# Patient Record
Sex: Female | Born: 1985 | Hispanic: No | Marital: Single | Smoking: Former smoker
Health system: Southern US, Community
[De-identification: ages and names within clinical notes are randomized; demographics above are authoritative.]

## PROBLEM LIST (undated history)

## (undated) ENCOUNTER — Inpatient Hospital Stay (HOSPITAL_COMMUNITY): Payer: Self-pay

## (undated) DIAGNOSIS — Z789 Other specified health status: Secondary | ICD-10-CM

## (undated) HISTORY — PX: INDUCED ABORTION: SHX677

## (undated) HISTORY — PX: DILATION AND CURETTAGE OF UTERUS: SHX78

---
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
## 2005-06-26 ENCOUNTER — Emergency Department (HOSPITAL_COMMUNITY): Admission: EM | Admit: 2005-06-26 | Discharge: 2005-06-26 | Payer: Self-pay | Admitting: Emergency Medicine

## 2006-02-02 ENCOUNTER — Emergency Department (HOSPITAL_COMMUNITY): Admission: EM | Admit: 2006-02-02 | Discharge: 2006-02-02 | Payer: Self-pay | Admitting: Emergency Medicine

## 2007-06-29 ENCOUNTER — Emergency Department (HOSPITAL_COMMUNITY): Admission: EM | Admit: 2007-06-29 | Discharge: 2007-06-29 | Payer: Self-pay | Admitting: Emergency Medicine

## 2008-02-20 ENCOUNTER — Emergency Department (HOSPITAL_COMMUNITY): Admission: EM | Admit: 2008-02-20 | Discharge: 2008-02-20 | Payer: Self-pay | Admitting: Emergency Medicine

## 2008-03-16 ENCOUNTER — Emergency Department (HOSPITAL_COMMUNITY): Admission: EM | Admit: 2008-03-16 | Discharge: 2008-03-16 | Payer: Self-pay | Admitting: Emergency Medicine

## 2009-12-20 ENCOUNTER — Emergency Department (HOSPITAL_COMMUNITY): Admission: EM | Admit: 2009-12-20 | Discharge: 2009-12-20 | Payer: Self-pay | Admitting: Emergency Medicine

## 2010-09-23 ENCOUNTER — Ambulatory Visit: Payer: Self-pay | Admitting: Obstetrics and Gynecology

## 2010-09-23 ENCOUNTER — Encounter (INDEPENDENT_AMBULATORY_CARE_PROVIDER_SITE_OTHER): Payer: Self-pay | Admitting: *Deleted

## 2010-10-07 ENCOUNTER — Ambulatory Visit: Payer: Self-pay | Admitting: Obstetrics and Gynecology

## 2010-10-12 ENCOUNTER — Emergency Department (HOSPITAL_COMMUNITY)
Admission: EM | Admit: 2010-10-12 | Discharge: 2010-10-12 | Payer: Self-pay | Source: Home / Self Care | Admitting: Emergency Medicine

## 2011-01-07 ENCOUNTER — Ambulatory Visit: Payer: Self-pay | Admitting: Obstetrics and Gynecology
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
## 2011-04-09 NOTE — H&P (Signed)
NAME:  Christine Hudson, APONTE                         ACCOUNT NO.:  [id number]   MEDICAL RECORD NO.:  [id number]                   PATIENT TYPE:  INP   LOCATION:  9151                                 [facility]:  [facility]   PHYSICIAN:  Greig Castilla M.D. [place]            DATE OF BIRTH:  [birth year]   DATE OF ADMISSION:  01/23/2003  DATE OF DISCHARGE:                                HISTORY & PHYSICAL   ATTENDING PHYSICIAN:  Mary Sella. Orlene Erm, M.D.   CHIEF COMPLAINT:  Contractions, vaginal bleeding.   HISTORY OF PRESENT ILLNESS:  The patient is a 25 year old African-American  female, G1, P0, who presented to MAU with three to four days of contractions  and 1.5 to 2 hours of vaginal bleeding.  She describes the bleeding as  spotting of several pads.  She has not had bleeding previously during her  pregnancy.  She denies any abdominal pain, fetal movement is good.  She has  had no leakage of fluids.  She denies any fever or chills.  With her  contractions the patient complains of back and abdominal pain but is  asymptomatic in between.  She has no other history of preterm contractions.   PRENATAL HISTORY:  In reviewing the patient's prenatal history the patient  has had Chlamydia X2.  This pregnancy appears to have been relatively  uncomplicated.  Based on the patient's menstrual history, her estimated due  date was February 19, 2003.  Review of an ultrasound from September 27, 2002,  when the patient was noted to be sixteen weeks and five days, changed the  due date to March 09, 2003, which is now the current due date.   PAST MEDICAL HISTORY:  None.   PAST SURGICAL HISTORY:  None.   OBSTETRIC HISTORY:  None.   FAMILY HISTORY:  Significant for hypertension.   PHYSICAL EXAMINATION:  VITAL SIGNS:  Temperature is afebrile. Pulse is 80.  Respiratory rate is 16.  Blood pressure 102/50 today.  GENERAL:  The patient is alert and oriented in no acute distress sitting  comfortably in bed.  HEENT: PERRL,  EOMI.  NECK:  Supple.  HEART:  RRR.  No M/R/G.  Normal S1 and S2.  LUNGS:  CTA.  ABDOMEN:  Soft, gravid, nontender, normal active bowel sounds, no masses.  Sterile speculum examination performed with no evidence of vaginal wall  lacerations or cervical anomalies.  Cervix did not appear to be  significantly friable.  Digital examination performed after sterile speculum  examination showed external os at 1 to 2 cm, thick, minus 3 and soft.   Fetal heart tones showed baseline rate of 130 to 140 with good beat to beat  variability, mild acceleration is present but no decelerations present.  The  fetal heart tones were not reactive, however, given the contractions,  negative CST was present.  On tocolysis contractions were occurring every  two to five minutes with a duration of one  minute with mild intensity.   LABORATORY DATA:  The laboratory data is currently pending.   ASSESSMENT:  Intrauterine pregnancy at 33 weeks, 5 days based on second  trimester ultrasound.  Patient comes in with several days of contractions,  she has also had vaginal bleeding for a couple of hours.  The differential  diagnosis for third trimester bleeding is relatively well known, therefore  we will admit the patient and work her up appropriately.   PLAN:  Admit the patient to antenatal.  Laboratory data is currently pending  including CBC, GCC, wet prep, urinalysis and culture, GBS.  Will obtain an  ultrasound in the morning to look for any evidence of previa.  Will place  the patient on a monitor and watch for contractions as well as watch for  fetal activity.  Will give one dose of terbutaline now to try to knock out  the contractions and if terbutaline does not work, consider magnesium.  Will  start the patient on lactated Ringer's at a rate of 125 cc per hour and  place her on bed rest with bathroom privileges.  Will discuss with Dr. Orlene Erm  as well.                                               Greig Castilla  M.D. [place]    AMD/MEDQ  D:  01/23/2003  T:  01/23/2003  Job:  [id number]

## 2011-09-06 LAB — URINALYSIS, ROUTINE W REFLEX MICROSCOPIC
Bilirubin Urine: NEGATIVE
Hgb urine dipstick: NEGATIVE
Specific Gravity, Urine: 1.019
pH: 5.5

## 2011-09-06 LAB — URINE MICROSCOPIC-ADD ON

## 2011-10-10 ENCOUNTER — Inpatient Hospital Stay (HOSPITAL_COMMUNITY)
Admission: AD | Admit: 2011-10-10 | Discharge: 2011-10-11 | Disposition: A | Discharge status: Home / Self Care | Payer: Self-pay | Source: Ambulatory Visit | Attending: Obstetrics & Gynecology | Admitting: Obstetrics & Gynecology

## 2011-10-10 DIAGNOSIS — D649 Anemia, unspecified: Secondary | ICD-10-CM | POA: Insufficient documentation

## 2011-10-10 DIAGNOSIS — A5901 Trichomonal vulvovaginitis: Secondary | ICD-10-CM | POA: Insufficient documentation

## 2011-10-10 DIAGNOSIS — Z3480 Encounter for supervision of other normal pregnancy, unspecified trimester: Secondary | ICD-10-CM

## 2011-10-10 DIAGNOSIS — O98819 Other maternal infectious and parasitic diseases complicating pregnancy, unspecified trimester: Secondary | ICD-10-CM | POA: Insufficient documentation

## 2011-10-10 DIAGNOSIS — R109 Unspecified abdominal pain: Secondary | ICD-10-CM | POA: Insufficient documentation

## 2011-10-10 DIAGNOSIS — O99019 Anemia complicating pregnancy, unspecified trimester: Secondary | ICD-10-CM | POA: Insufficient documentation

## 2011-10-10 HISTORY — DX: Other specified health status: Z78.9

## 2011-10-10 NOTE — Progress Notes (Signed)
Pt LMP 10/3, having lower abd cramping and dizziness x 2 wks.  Denies vaginal bleeding or discharge.

## 2011-10-11 ENCOUNTER — Inpatient Hospital Stay (HOSPITAL_COMMUNITY): Payer: Self-pay

## 2011-10-11 ENCOUNTER — Encounter (HOSPITAL_COMMUNITY): Payer: Self-pay | Admitting: *Deleted

## 2011-10-11 LAB — CBC
HCT: 34.3 % — ABNORMAL LOW (ref 36.0–46.0)
Hemoglobin: 10.9 g/dL — ABNORMAL LOW (ref 12.0–15.0)
MCH: 23 pg — ABNORMAL LOW (ref 26.0–34.0)
MCHC: 31.8 g/dL (ref 30.0–36.0)
RBC: 4.74 MIL/uL (ref 3.87–5.11)

## 2011-10-11 LAB — POCT PREGNANCY, URINE: Preg Test, Ur: POSITIVE

## 2011-10-11 LAB — WET PREP, GENITAL: Yeast Wet Prep HPF POC: NONE SEEN

## 2011-10-11 LAB — URINALYSIS, ROUTINE W REFLEX MICROSCOPIC
Glucose, UA: NEGATIVE mg/dL
Ketones, ur: 15 mg/dL — AB
Leukocytes, UA: NEGATIVE
Protein, ur: NEGATIVE mg/dL

## 2011-10-11 MED ORDER — ONDANSETRON HCL 8 MG PO TABS: 8.0000 mg | ORAL_TABLET | Freq: Three times a day (TID) | ORAL | Status: AC | PRN

## 2011-10-11 MED ORDER — CONCEPT OB 130-92.4-1 MG PO CAPS: 1.0000 | ORAL_CAPSULE | Freq: Every day | ORAL | Status: DC

## 2011-10-11 MED ORDER — METRONIDAZOLE 500 MG PO TABS
2000.0000 mg | ORAL_TABLET | Freq: Once | ORAL | Status: AC
  Administered 2011-10-11: 2000 mg via ORAL
  Filled 2011-10-11: qty 4

## 2011-10-11 MED ORDER — ONDANSETRON HCL 4 MG PO TABS
4.0000 mg | ORAL_TABLET | Freq: Once | ORAL | Status: AC
  Administered 2011-10-11: 4 mg via ORAL
  Filled 2011-10-11: qty 1

## 2011-10-11 NOTE — ED Provider Notes (Signed)
History   Christine Hudson is a 25 y.o. year old G19P0021 female at 6.[redacted] weeks gestation by LMP who presents to MAU reporting low abd cramping and dizziness x 2 weeks. She denies vaginal bleeding, discharge.  CSN: [id number] Arrival date & time: 10/10/2011 11:14 PM   None    Chief Complaint  Patient presents with  . Abdominal Cramping  . Dizziness    (Consider location/radiation/quality/duration/timing/severity/associated sxs/prior treatment) HPI  Past Medical History  Diagnosis Date  . No pertinent past medical history     Past Surgical History  Procedure Date  . No past surgeries     No family history on file.  History  Substance Use Topics  . Smoking status: Current Everyday Smoker  . Smokeless tobacco: Not on file  . Alcohol Use: No    OB History    Grav Para Term Preterm Abortions TAB SAB Ect Mult Living   4 1   2 2    1       Review of Systems  Constitutional: Negative for fever and chills.  Gastrointestinal: Positive for abdominal pain (left  low abd/groin ), diarrhea and constipation. Negative for nausea and vomiting.  Genitourinary: Negative for dysuria, urgency, frequency, hematuria, flank pain and vaginal bleeding.    Allergies  Review of patient's allergies indicates no known allergies.  Home Medications  No current outpatient prescriptions on file.  BP 110/64  Pulse 82  Temp(Src) 98.4 F (36.9 C) (Oral)  Resp 16  Ht 5\' 4"  (1.626 m)  Wt 84.732 kg (186 lb 12.8 oz)  BMI 32.06 kg/m2  SpO2 99%  LMP 08/24/2011  Physical Exam  Constitutional: She is oriented to person, place, and time. She appears well-developed and well-nourished. No distress.  Cardiovascular: Normal rate.   Pulmonary/Chest: Effort normal.  Abdominal: Soft. There is no tenderness. There is no rebound and no guarding.  Genitourinary: There is no lesion on the right labia. There is no lesion on the left labia. Uterus is not tender. Cervix exhibits no motion tenderness and no  friability. Right adnexum displays no mass and no tenderness. Left adnexum displays no mass and no tenderness. No erythema or bleeding around the vagina. Vaginal discharge (mod amount of thin, yellow, frothy, malodorous discharge) found.  Neurological: She is alert and oriented to person, place, and time.  Skin: Skin is warm and dry.  Psychiatric: She has a normal mood and affect.    ED Course  Procedures (including critical care time)  Labs Reviewed  URINALYSIS, ROUTINE W REFLEX MICROSCOPIC - Abnormal; Notable for the following:    Specific Gravity, Urine >1.030 (*)    Ketones, ur 15 (*)    All other components within normal limits  WET PREP, GENITAL - Abnormal; Notable for the following:    Trich, Wet Prep FEW (*)    WBC, Wet Prep HPF POC MODERATE (*) FEW BACTERIA SEEN   All other components within normal limits  POCT PREGNANCY, URINE  GC/CHLAMYDIA PROBE AMP, GENITAL  CBC   Flagyl 2 gm PO given w/ Zofran  MDM  Assessment: 1. Trichomonas 2. 10.3 week IUP 3. RLP 4. Mild anemia  Plan: 1. D/C home. 2. Start [facility] ASAP 3. Need partner Tx. 4. FeSo4 OTC. Increase dietary iron 5. Comfort measures  Dorathy Kinsman 10/11/2011 5:41 AM

## 2011-10-12 LAB — GC/CHLAMYDIA PROBE AMP, GENITAL
Chlamydia, DNA Probe: NEGATIVE
GC Probe Amp, Genital: NEGATIVE

## 2011-10-12 NOTE — ED Provider Notes (Signed)
Agree with above note.  Reesha Debes H. 10/12/2011 4:26 PM

## 2011-11-23 NOTE — L&D Delivery Note (Signed)
Delivery Note At 3:12 PM a viable unspecified sex was delivered via Vaginal, Spontaneous Delivery (Presentation: ; Occiput Anterior).  APGAR: 7, 8; weight .   Placenta status: Intact, Spontaneous.  Cord: 3 vessels with the following complications: None.  Cord pH: not done  Anesthesia: Epidural  Episiotomy: None Lacerations: None Suture Repair: 2.0 Est. Blood Loss (mL): 250  Mom to postpartum.  Baby to nursery-stable.  Reagen Goates A 05/09/2012, 3:17 PM

## 2011-12-13 ENCOUNTER — Emergency Department (HOSPITAL_COMMUNITY): Payer: No Typology Code available for payment source

## 2011-12-13 ENCOUNTER — Encounter (HOSPITAL_COMMUNITY): Payer: Self-pay | Admitting: *Deleted

## 2011-12-13 ENCOUNTER — Emergency Department (HOSPITAL_COMMUNITY)
Admission: EM | Admit: 2011-12-13 | Discharge: 2011-12-14 | Disposition: A | Discharge status: Home / Self Care | Payer: No Typology Code available for payment source | Attending: Emergency Medicine | Admitting: Emergency Medicine

## 2011-12-13 ENCOUNTER — Ambulatory Visit (HOSPITAL_COMMUNITY): Payer: Self-pay

## 2011-12-13 DIAGNOSIS — N939 Abnormal uterine and vaginal bleeding, unspecified: Secondary | ICD-10-CM

## 2011-12-13 DIAGNOSIS — M549 Dorsalgia, unspecified: Secondary | ICD-10-CM | POA: Insufficient documentation

## 2011-12-13 DIAGNOSIS — O2340 Unspecified infection of urinary tract in pregnancy, unspecified trimester: Secondary | ICD-10-CM

## 2011-12-13 DIAGNOSIS — O99330 Smoking (tobacco) complicating pregnancy, unspecified trimester: Secondary | ICD-10-CM | POA: Insufficient documentation

## 2011-12-13 DIAGNOSIS — R109 Unspecified abdominal pain: Secondary | ICD-10-CM | POA: Insufficient documentation

## 2011-12-13 DIAGNOSIS — O26859 Spotting complicating pregnancy, unspecified trimester: Secondary | ICD-10-CM | POA: Insufficient documentation

## 2011-12-13 DIAGNOSIS — O2390 Unspecified genitourinary tract infection in pregnancy, unspecified trimester: Secondary | ICD-10-CM | POA: Insufficient documentation

## 2011-12-13 DIAGNOSIS — O99891 Other specified diseases and conditions complicating pregnancy: Secondary | ICD-10-CM | POA: Insufficient documentation

## 2011-12-13 DIAGNOSIS — N390 Urinary tract infection, site not specified: Secondary | ICD-10-CM | POA: Insufficient documentation

## 2011-12-13 LAB — COMPREHENSIVE METABOLIC PANEL
Albumin: 3.3 g/dL — ABNORMAL LOW (ref 3.5–5.2)
Alkaline Phosphatase: 58 U/L (ref 39–117)
BUN: 5 mg/dL — ABNORMAL LOW (ref 6–23)
Chloride: 102 mEq/L (ref 96–112)
Creatinine, Ser: 0.48 mg/dL — ABNORMAL LOW (ref 0.50–1.10)
GFR calc Af Amer: >90 mL/min (ref >90)
GFR calc non Af Amer: >90 mL/min (ref >90)
Glucose, Bld: 69 mg/dL — ABNORMAL LOW (ref 70–99)
Potassium: 2.9 mEq/L — ABNORMAL LOW (ref 3.5–5.1)
Total Bilirubin: 0.3 mg/dL (ref 0.3–1.2)

## 2011-12-13 LAB — TYPE AND SCREEN
ABO/RH(D): O POS
Antibody Screen: NEGATIVE

## 2011-12-13 LAB — CBC
HCT: 33.3 % — ABNORMAL LOW (ref 36.0–46.0)
Hemoglobin: 10.6 g/dL — ABNORMAL LOW (ref 12.0–15.0)
MCV: 70.6 fL — ABNORMAL LOW (ref 78.0–100.0)
RDW: 14.2 % (ref 11.5–15.5)
WBC: 8 10*3/uL (ref 4.0–10.5)

## 2011-12-13 LAB — ABO/RH: ABO/RH(D): O POS

## 2011-12-13 MED ORDER — POTASSIUM CHLORIDE CRYS ER 20 MEQ PO TBCR
40.0000 meq | EXTENDED_RELEASE_TABLET | Freq: Once | ORAL | Status: AC
  Administered 2011-12-13: 40 meq via ORAL
  Filled 2011-12-13: qty 2
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
## 2011-12-13 NOTE — ED Notes (Signed)
Pt to US.
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
## 2011-12-13 NOTE — ED Notes (Signed)
Pt remains in US

## 2011-12-13 NOTE — ED Notes (Signed)
Paged OB Rapid Response RN @ 21:45

## 2011-12-14 ENCOUNTER — Inpatient Hospital Stay (HOSPITAL_COMMUNITY)
Admission: AD | Admit: 2011-12-14 | Discharge: 2011-12-14 | Discharge status: Against Medical Advice | Payer: No Typology Code available for payment source | Source: Ambulatory Visit | Attending: Obstetrics & Gynecology | Admitting: Obstetrics & Gynecology

## 2011-12-14 LAB — URINALYSIS, ROUTINE W REFLEX MICROSCOPIC
Glucose, UA: NEGATIVE mg/dL
Hgb urine dipstick: NEGATIVE
Ketones, ur: 15 mg/dL — AB
Protein, ur: NEGATIVE mg/dL
Urobilinogen, UA: 4 mg/dL — ABNORMAL HIGH (ref 0.0–1.0)

## 2011-12-14 LAB — URINE MICROSCOPIC-ADD ON

## 2011-12-14 MED ORDER — NITROFURANTOIN MONOHYD MACRO 100 MG PO CAPS: 100.0000 mg | ORAL_CAPSULE | Freq: Two times a day (BID) | ORAL | Status: DC

## 2011-12-14 NOTE — ED Notes (Signed)
Called, not in lobby 

## 2011-12-14 NOTE — ED Provider Notes (Signed)
I saw and evaluated the patient, reviewed the resident's note and I agree with the findings and plan.  Cyndee Giammarco T Shamonique Battiste, MD 12/14/11 0944 

## 2011-12-14 NOTE — ED Notes (Signed)
Called not in lobby.

## 2011-12-14 NOTE — ED Notes (Signed)
Pt given a [place] sandwich and gingerales to drink.  Urine obtained.

## 2011-12-23 ENCOUNTER — Inpatient Hospital Stay (HOSPITAL_COMMUNITY)
Admission: AD | Admit: 2011-12-23 | Discharge: 2011-12-23 | Disposition: A | Discharge status: Home / Self Care | Payer: No Typology Code available for payment source | Source: Ambulatory Visit | Attending: Obstetrics and Gynecology | Admitting: Obstetrics and Gynecology

## 2011-12-23 ENCOUNTER — Encounter (HOSPITAL_COMMUNITY): Payer: Self-pay

## 2011-12-23 ENCOUNTER — Inpatient Hospital Stay (HOSPITAL_COMMUNITY): Payer: No Typology Code available for payment source

## 2011-12-23 DIAGNOSIS — O26899 Other specified pregnancy related conditions, unspecified trimester: Secondary | ICD-10-CM

## 2011-12-23 DIAGNOSIS — Z3490 Encounter for supervision of normal pregnancy, unspecified, unspecified trimester: Secondary | ICD-10-CM

## 2011-12-23 DIAGNOSIS — M549 Dorsalgia, unspecified: Secondary | ICD-10-CM

## 2011-12-23 DIAGNOSIS — O99891 Other specified diseases and conditions complicating pregnancy: Secondary | ICD-10-CM | POA: Insufficient documentation

## 2011-12-23 MED ORDER — CYCLOBENZAPRINE HCL 10 MG PO TABS: 10.0000 mg | ORAL_TABLET | Freq: Three times a day (TID) | ORAL | Status: AC | PRN
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
## 2011-12-23 NOTE — Plan of Care (Signed)
Patient is not in the lobby when called to triage.  
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
## 2011-12-23 NOTE — Plan of Care (Signed)
Patient called to triage, was on the phone and continued to talk and asked for more time on the phone.

## 2011-12-24 NOTE — ED Provider Notes (Signed)
Agree with above note.  Naman Spychalski 12/24/2011 5:58 AM

## 2012-03-03 LAB — OB RESULTS CONSOLE HIV ANTIBODY (ROUTINE TESTING): HIV: NONREACTIVE

## 2012-04-03 LAB — OB RESULTS CONSOLE GBS: GBS: POSITIVE

## 2012-05-08 ENCOUNTER — Inpatient Hospital Stay (HOSPITAL_COMMUNITY)
Admission: AD | Admit: 2012-05-08 | Discharge: 2012-05-11 | DRG: 775 | Disposition: A | Discharge status: Home / Self Care | Payer: Medicaid Other | Attending: Obstetrics | Admitting: Obstetrics

## 2012-05-08 ENCOUNTER — Encounter (HOSPITAL_COMMUNITY): Payer: Self-pay | Admitting: *Deleted

## 2012-05-08 DIAGNOSIS — O99892 Other specified diseases and conditions complicating childbirth: Principal | ICD-10-CM | POA: Diagnosis present

## 2012-05-08 DIAGNOSIS — Z22330 Carrier of Group B streptococcus: Secondary | ICD-10-CM

## 2012-05-08 NOTE — MAU Note (Signed)
Dr Gaynell Face notified of patients arrival, 40.3 wks + GBS, having contractions q 10-15 min and has had one since arrival to MAU. Plan, MD gave order to walk for 1-2 hrs and recheck cervix; if no change discharge to home.

## 2012-05-08 NOTE — MAU Note (Signed)
Went looking for patient. Patient walking 1 hour past time. Pt walking outside, mother called patient to inform inform her she needed to come back for exam

## 2012-05-08 NOTE — MAU Note (Signed)
Pt states, " I've had contractions since 4 pm, and they are 10-15 min apart."

## 2012-05-09 ENCOUNTER — Inpatient Hospital Stay (HOSPITAL_COMMUNITY): Payer: Medicaid Other | Admitting: Anesthesiology

## 2012-05-09 ENCOUNTER — Encounter (HOSPITAL_COMMUNITY): Payer: Self-pay | Admitting: Anesthesiology

## 2012-05-09 ENCOUNTER — Encounter (HOSPITAL_COMMUNITY): Payer: Self-pay | Admitting: *Deleted

## 2012-05-09 LAB — CBC
MCH: 22.2 pg — ABNORMAL LOW (ref 26.0–34.0)
MCHC: 31.5 g/dL (ref 30.0–36.0)
MCV: 70.7 fL — ABNORMAL LOW (ref 78.0–100.0)
Platelets: 236 10*3/uL (ref 150–400)
RBC: 4.54 MIL/uL (ref 3.87–5.11)

## 2012-05-09 LAB — SYPHILIS: RPR W/REFLEX TO RPR TITER AND TREPONEMAL ANTIBODIES, TRADITIONAL SCREENING AND DIAGNOSIS ALGORITHM: RPR Ser Ql: NONREACTIVE

## 2012-05-09 MED ORDER — OXYTOCIN 40 UNITS IN LACTATED RINGERS INFUSION - SIMPLE MED
1.0000 m[IU]/min | INTRAVENOUS | Status: DC
  Administered 2012-05-09: 1 m[IU]/min via INTRAVENOUS
  Filled 2012-05-09: qty 1000

## 2012-05-09 MED ORDER — LIDOCAINE HCL (PF) 1 % IJ SOLN
30.0000 mL | INTRAMUSCULAR | Status: DC | PRN
  Filled 2012-05-09: qty 30

## 2012-05-09 MED ORDER — OXYCODONE-ACETAMINOPHEN 5-325 MG PO TABS: 1.0000 | ORAL_TABLET | ORAL | Status: DC | PRN

## 2012-05-09 MED ORDER — ONDANSETRON HCL 4 MG/2ML IJ SOLN
4.0000 mg | Freq: Four times a day (QID) | INTRAMUSCULAR | Status: DC | PRN
  Administered 2012-05-09: 4 mg via INTRAVENOUS
  Filled 2012-05-09: qty 2

## 2012-05-09 MED ORDER — ONDANSETRON HCL 4 MG PO TABS: 4.0000 mg | ORAL_TABLET | ORAL | Status: DC | PRN

## 2012-05-09 MED ORDER — OXYTOCIN BOLUS FROM INFUSION
250.0000 mL | Freq: Once | INTRAVENOUS | Status: AC
  Administered 2012-05-09: 250 mL via INTRAVENOUS
  Filled 2012-05-09: qty 500

## 2012-05-09 MED ORDER — PHENYLEPHRINE 40 MCG/ML (10ML) SYRINGE FOR IV PUSH (FOR BLOOD PRESSURE SUPPORT)
80.0000 ug | PREFILLED_SYRINGE | INTRAVENOUS | Status: DC | PRN
  Filled 2012-05-09: qty 2
  Filled 2012-05-09: qty 5

## 2012-05-09 MED ORDER — TERBUTALINE SULFATE 1 MG/ML IJ SOLN: 0.2500 mg | Freq: Once | INTRAMUSCULAR | Status: DC | PRN

## 2012-05-09 MED ORDER — LACTATED RINGERS IV SOLN
INTRAVENOUS | Status: DC
  Administered 2012-05-09: 05:00:00 via INTRAVENOUS

## 2012-05-09 MED ORDER — PRENATAL MULTIVITAMIN CH
1.0000 | ORAL_TABLET | Freq: Every day | ORAL | Status: DC
  Administered 2012-05-10 – 2012-05-11 (×2): 1 via ORAL
  Filled 2012-05-09 (×2): qty 1

## 2012-05-09 MED ORDER — WITCH HAZEL-GLYCERIN EX PADS: 1.0000 "application " | MEDICATED_PAD | CUTANEOUS | Status: DC | PRN

## 2012-05-09 MED ORDER — CITRIC ACID-SODIUM CITRATE 334-500 MG/5ML PO SOLN: 30.0000 mL | ORAL | Status: DC | PRN

## 2012-05-09 MED ORDER — FENTANYL 2.5 MCG/ML BUPIVACAINE 1/10 % EPIDURAL INFUSION (WH - ANES)
14.0000 mL/h | INTRAMUSCULAR | Status: DC
  Administered 2012-05-09 (×2): 14 mL/h via EPIDURAL
  Filled 2012-05-09 (×2): qty 60

## 2012-05-09 MED ORDER — ZOLPIDEM TARTRATE 5 MG PO TABS: 5.0000 mg | ORAL_TABLET | Freq: Every evening | ORAL | Status: DC | PRN

## 2012-05-09 MED ORDER — SENNOSIDES-DOCUSATE SODIUM 8.6-50 MG PO TABS
2.0000 | ORAL_TABLET | Freq: Every day | ORAL | Status: DC
  Administered 2012-05-09 – 2012-05-10 (×2): 2 via ORAL

## 2012-05-09 MED ORDER — PENICILLIN G POTASSIUM 5000000 UNITS IJ SOLR
5.0000 10*6.[IU] | Freq: Once | INTRAVENOUS | Status: AC
  Administered 2012-05-09: 5 10*6.[IU] via INTRAVENOUS
  Filled 2012-05-09: qty 5

## 2012-05-09 MED ORDER — PHENYLEPHRINE 40 MCG/ML (10ML) SYRINGE FOR IV PUSH (FOR BLOOD PRESSURE SUPPORT)
80.0000 ug | PREFILLED_SYRINGE | INTRAVENOUS | Status: DC | PRN
  Filled 2012-05-09: qty 2

## 2012-05-09 MED ORDER — LANOLIN HYDROUS EX OINT: TOPICAL_OINTMENT | CUTANEOUS | Status: DC | PRN

## 2012-05-09 MED ORDER — DIPHENHYDRAMINE HCL 25 MG PO CAPS: 25.0000 mg | ORAL_CAPSULE | Freq: Four times a day (QID) | ORAL | Status: DC | PRN

## 2012-05-09 MED ORDER — LACTATED RINGERS IV SOLN
500.0000 mL | Freq: Once | INTRAVENOUS | Status: AC
  Administered 2012-05-09: 1000 mL via INTRAVENOUS

## 2012-05-09 MED ORDER — EPHEDRINE 5 MG/ML INJ
10.0000 mg | INTRAVENOUS | Status: DC | PRN
  Filled 2012-05-09: qty 2
  Filled 2012-05-09: qty 4

## 2012-05-09 MED ORDER — IBUPROFEN 600 MG PO TABS
600.0000 mg | ORAL_TABLET | Freq: Four times a day (QID) | ORAL | Status: DC | PRN
  Administered 2012-05-09: 600 mg via ORAL
  Filled 2012-05-09 (×3): qty 1

## 2012-05-09 MED ORDER — ONDANSETRON HCL 4 MG/2ML IJ SOLN: 4.0000 mg | INTRAMUSCULAR | Status: DC | PRN

## 2012-05-09 MED ORDER — TETANUS-DIPHTH-ACELL PERTUSSIS 5-2.5-18.5 LF-MCG/0.5 IM SUSP: 0.5000 mL | Freq: Once | INTRAMUSCULAR | Status: DC

## 2012-05-09 MED ORDER — OXYTOCIN 40 UNITS IN LACTATED RINGERS INFUSION - SIMPLE MED
62.5000 mL/h | Freq: Once | INTRAVENOUS | Status: AC
  Administered 2012-05-09: 2.5 [IU]/h via INTRAVENOUS

## 2012-05-09 MED ORDER — SIMETHICONE 80 MG PO CHEW: 80.0000 mg | CHEWABLE_TABLET | ORAL | Status: DC | PRN

## 2012-05-09 MED ORDER — LIDOCAINE HCL (PF) 1 % IJ SOLN
INTRAMUSCULAR | Status: DC | PRN
  Administered 2012-05-09 (×2): 5 mL

## 2012-05-09 MED ORDER — FLEET ENEMA 7-19 GM/118ML RE ENEM: 1.0000 | ENEMA | RECTAL | Status: DC | PRN

## 2012-05-09 MED ORDER — ACETAMINOPHEN 325 MG PO TABS: 650.0000 mg | ORAL_TABLET | ORAL | Status: DC | PRN

## 2012-05-09 MED ORDER — IBUPROFEN 600 MG PO TABS
600.0000 mg | ORAL_TABLET | Freq: Four times a day (QID) | ORAL | Status: DC
  Administered 2012-05-10 – 2012-05-11 (×7): 600 mg via ORAL
  Filled 2012-05-09 (×5): qty 1

## 2012-05-09 MED ORDER — LACTATED RINGERS IV SOLN: 500.0000 mL | INTRAVENOUS | Status: DC | PRN

## 2012-05-09 MED ORDER — BENZOCAINE-MENTHOL 20-0.5 % EX AERO: 1.0000 "application " | INHALATION_SPRAY | CUTANEOUS | Status: DC | PRN

## 2012-05-09 MED ORDER — DIPHENHYDRAMINE HCL 50 MG/ML IJ SOLN
12.5000 mg | INTRAMUSCULAR | Status: DC | PRN
Start: 2012-05-09 — End: 2012-05-11

## 2012-05-09 MED ORDER — EPHEDRINE 5 MG/ML INJ
10.0000 mg | INTRAVENOUS | Status: DC | PRN
  Filled 2012-05-09: qty 2

## 2012-05-09 MED ORDER — DIBUCAINE 1 % RE OINT: 1.0000 "application " | TOPICAL_OINTMENT | RECTAL | Status: DC | PRN

## 2012-05-09 MED ORDER — FERROUS SULFATE 325 (65 FE) MG PO TABS
325.0000 mg | ORAL_TABLET | Freq: Two times a day (BID) | ORAL | Status: DC
  Administered 2012-05-10 – 2012-05-11 (×3): 325 mg via ORAL
  Filled 2012-05-09 (×3): qty 1

## 2012-05-09 MED ORDER — PENICILLIN G POTASSIUM 5000000 UNITS IJ SOLR
2.5000 10*6.[IU] | INTRAMUSCULAR | Status: DC
  Administered 2012-05-09 (×3): 2.5 10*6.[IU] via INTRAVENOUS
  Filled 2012-05-09 (×7): qty 2.5

## 2012-05-09 NOTE — Anesthesia Preprocedure Evaluation (Signed)

## 2012-05-09 NOTE — H&P (Signed)
This is Dr. Francoise Ceo dictating the history and physical on  Modean Asher she's a 26 year old gravida 5 para 202 to at 40 weeks and 4 days her EDC is 05/04/2012 is a positive GBS and she has been on penicillin she was admitted in labor cervix 3 cm 50% vertex -3 result on low-dose Pitocin her cervix is 3 cm 85% vertex -3 amniotomy was performed the fluids clear Past medical history negative Past surgical history negative System review negative Physical exam so well-developed female in labor HEENT negative Breasts negative Heart regular rhythm no murmurs no gallops Abdomen term Pelvic as described above Extremities negative

## 2012-05-09 NOTE — Anesthesia Procedure Notes (Signed)
Epidural Patient location during procedure: OB Start time: 05/09/2012 8:07 AM  Staffing Anesthesiologist: Brayton Caves R Performed by: anesthesiologist   Preanesthetic Checklist Completed: patient identified, site marked, surgical consent, pre-op evaluation, timeout performed, IV checked, risks and benefits discussed and monitors and equipment checked  Epidural Patient position: sitting Prep: site prepped and draped and DuraPrep Patient monitoring: continuous pulse ox and blood pressure Approach: midline Injection technique: LOR air and LOR saline  Needle:  Needle type: Tuohy  Needle gauge: 17 G Needle length: 9 cm Needle insertion depth: 7 cm Catheter type: closed end flexible Catheter size: 19 Gauge Catheter at skin depth: 12 cm Test dose: negative  Assessment Events: blood not aspirated, injection not painful, no injection resistance, negative IV test and no paresthesia  Additional Notes Patient identified.  Risk benefits discussed including failed block, incomplete pain control, headache, nerve damage, paralysis, blood pressure changes, nausea, vomiting, reactions to medication both toxic or allergic, and postpartum back pain.  Patient expressed understanding and wished to proceed.  All questions were answered.  Sterile technique used throughout procedure and epidural site dressed with sterile barrier dressing. No paresthesia or other complications noted.The patient did not experience any signs of intravascular injection such as tinnitus or metallic taste in mouth nor signs of intrathecal spread such as rapid motor block. Please see nursing notes for vital signs.

## 2012-05-09 NOTE — Consult Note (Signed)
Neonatology Note:  Attendance after delivery:  I was asked to room #166 following NSVD at 40+ weeks due to infant with some resp distress and needing stimulation. The mother is a G5P2A2 GBS pos who was on Pen G for > 12 hours and remained afebrile during labor. No fetal distress or difficulty with delivery. Infant cried at delivery, but them became sluggish per RN, requring stimulation for some HR decelerations and irregular breathing. ROM 8 hours prior to delivery, fluid clear. Ap 7/8/9. I arrived at 9 minutes of life, at which time the baby was pink in BBO2, with good tone and HR, but I could hear thick secretions in the mouth and nose. We bulb suctioned for large globs of thick mucous from both nares. Lungs were clear to ausc and there were no murmurs. Perfusion excellent, tone and activity excellent. O2 saturations came up into the 90s in room air after suctioning was done. I observed her until about 16 minutes of age and spoke with her parents to reassure them. Transferred to care of Pediatrician.  Emersen Carroll, MD  

## 2012-05-10 LAB — CBC
MCH: 22.4 pg — ABNORMAL LOW (ref 26.0–34.0)
MCHC: 31.8 g/dL (ref 30.0–36.0)
MCV: 70.6 fL — ABNORMAL LOW (ref 78.0–100.0)
Platelets: 193 10*3/uL (ref 150–400)

## 2012-05-10 NOTE — Anesthesia Postprocedure Evaluation (Signed)
  Anesthesia Post-op Note  Patient: Christine Hudson  Procedure(s) Performed: * No procedures listed *  Patient Location: 124   Anesthesia Type: Epidural  Level of Consciousness: awake, alert  and oriented  Airway and Oxygen Therapy: Patient Spontanous Breathing  Post-op Pain: mild  Post-op Assessment: Post-op Vital signs reviewed, Patient's Cardiovascular Status Stable, No headache, No backache, No residual numbness and No residual motor weakness  Post-op Vital Signs: Reviewed and stable  Complications: No apparent anesthesia complications

## 2012-05-10 NOTE — Progress Notes (Signed)
UR chart review completed.  

## 2012-05-11 NOTE — Discharge Instructions (Signed)
Discharge instructions   You can wash your hair  Shower  Eat what you want  Drink what you want  See me in 6 weeks  Your ankles are going to swell more in the next 2 weeks than when pregnant  No sex for 6 weeks   Demitris Pokorny A, MD 05/11/2012

## 2012-05-11 NOTE — Discharge Summary (Signed)
Obstetric Discharge Summary Reason for Admission: onset of labor Prenatal Procedures: none Intrapartum Procedures: spontaneous vaginal delivery Postpartum Procedures: none Complications-Operative and Postpartum: none Hemoglobin  Date Value Range Status  05/10/2012 9.0* 12.0 - 15.0 g/dL Final     HCT  Date Value Range Status  05/10/2012 28.3* 36.0 - 46.0 % Final    Physical Exam:  General: alert Lochia: appropriate Uterine Fundus: firm Incision: healing well DVT Evaluation: No evidence of DVT seen on physical exam.  Discharge Diagnoses: Term Pregnancy-delivered  Discharge Information: Date: 05/11/2012 Activity: pelvic rest Diet: routine Medications: Percocet Condition: stable Instructions: refer to practice specific booklet Discharge to: home Follow-up Information    Follow up with Sharleen Szczesny A, MD. Call in 6 weeks.   Contact information:   [address] [place] [address] [phone]          Newborn Data: Live born female  Birth Weight: 7 lb 11.6 oz (3505 g) APGAR: 7, 8  Home with mother.  Christine Hudson A 05/11/2012, 6:38 AM

## 2012-12-17 IMAGING — US US OB COMP LESS 14 WK
1 series · 14 of 24 positions shown · non-contrast
Comparison: None.

CLINICAL DATA: Pelvic pain, cramping.

OBSTETRIC <14 WK ULTRASOUND
TECHNIQUE: Transabdominal ultrasound was performed for evaluation
of the gestation as well as the maternal uterus and adnexal
regions.

[Series 1: us ob comp less 14 wks · 14 of 24 slices shown]
[im 1/24]
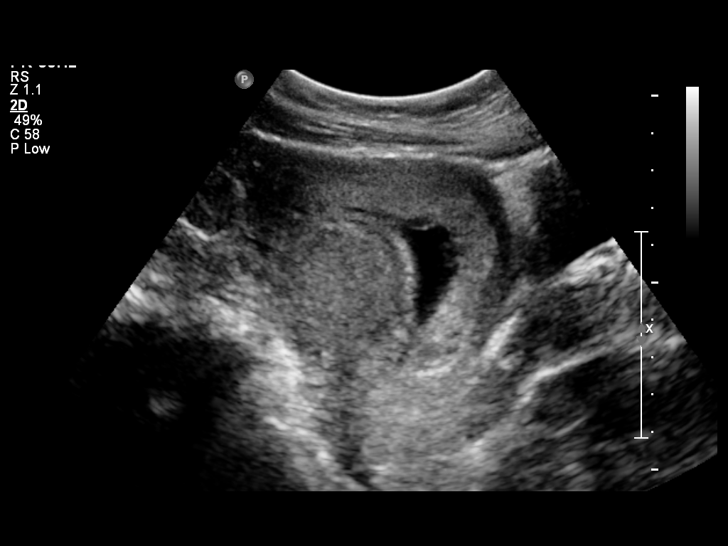
[im 3/24]
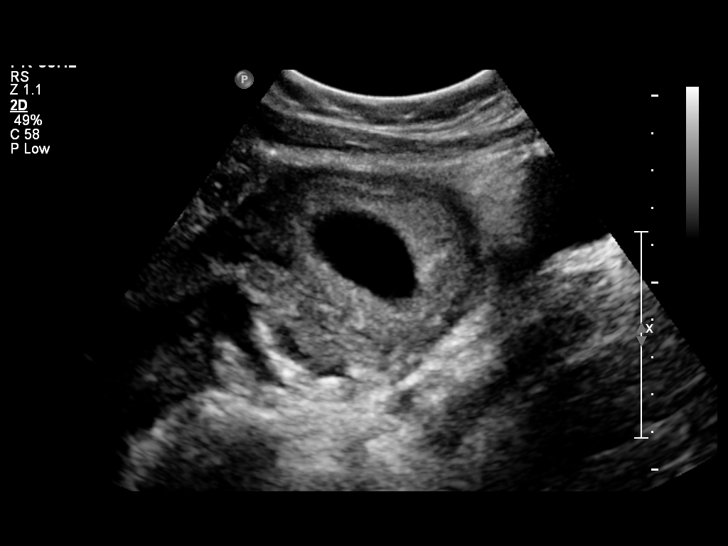
[im 5/24]
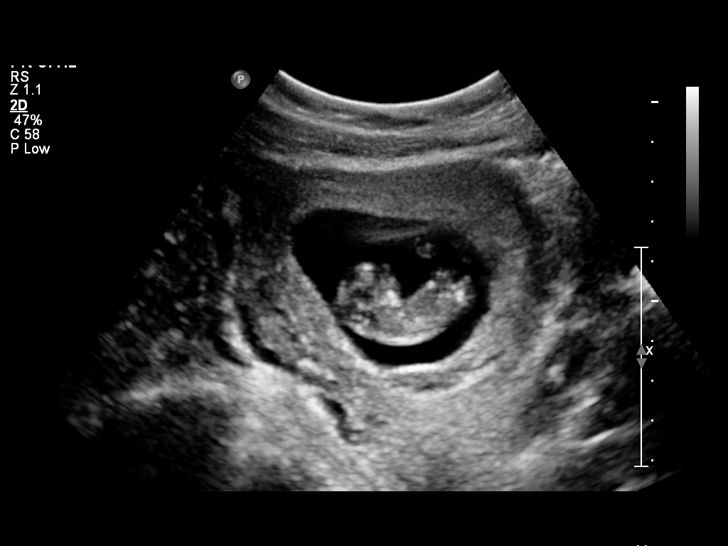
[im 7/24]
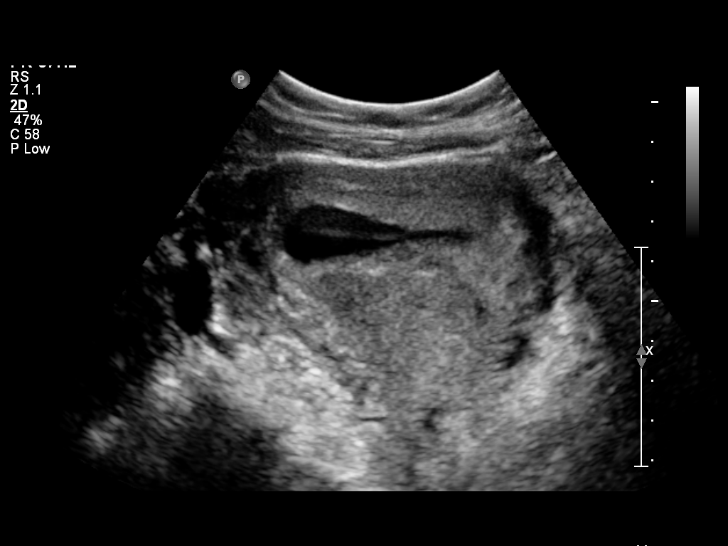
[im 8/24]
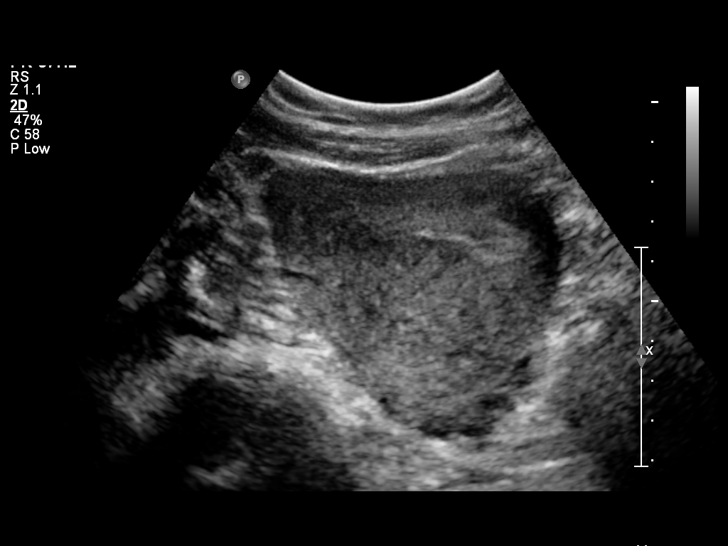
[im 10/24]
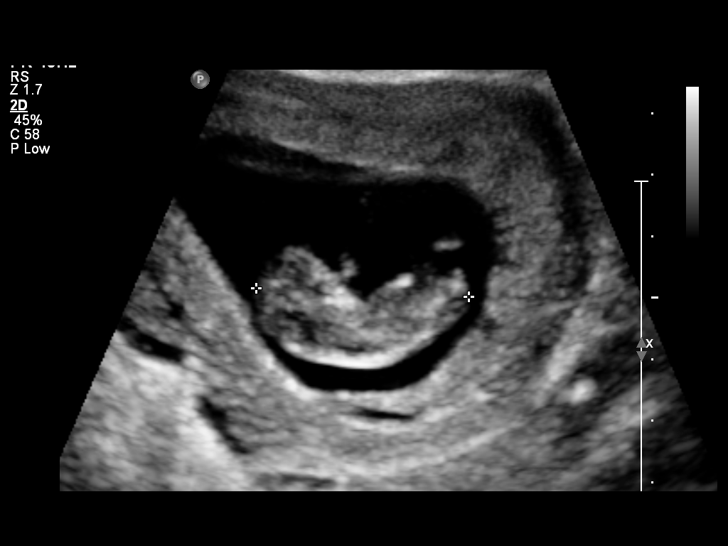
[im 12/24]
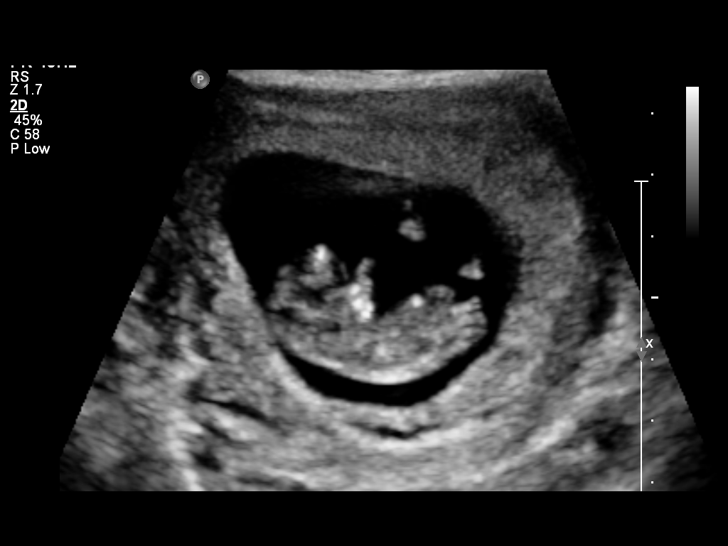
[im 13/24]
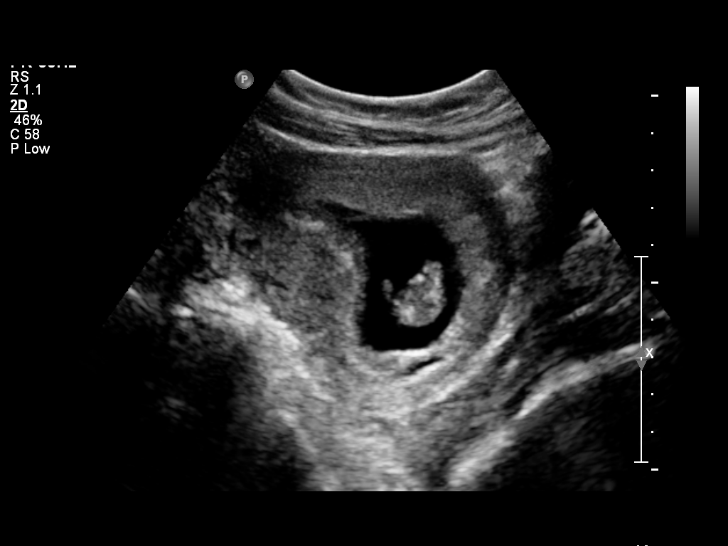
[im 15/24]
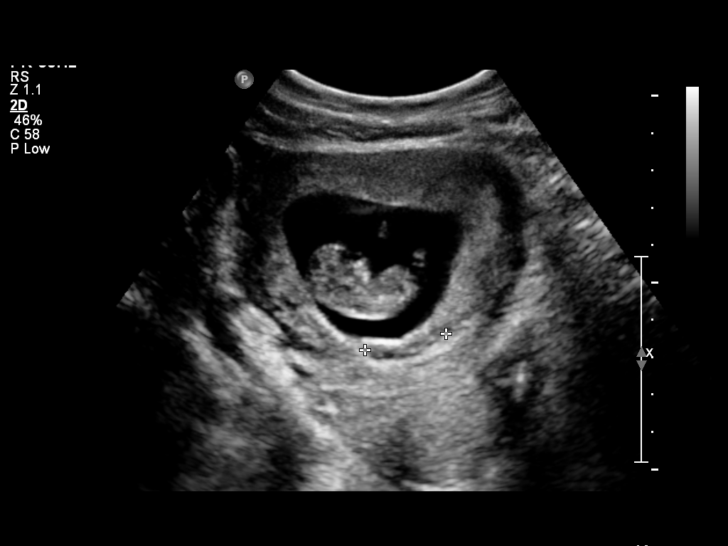
[im 17/24]
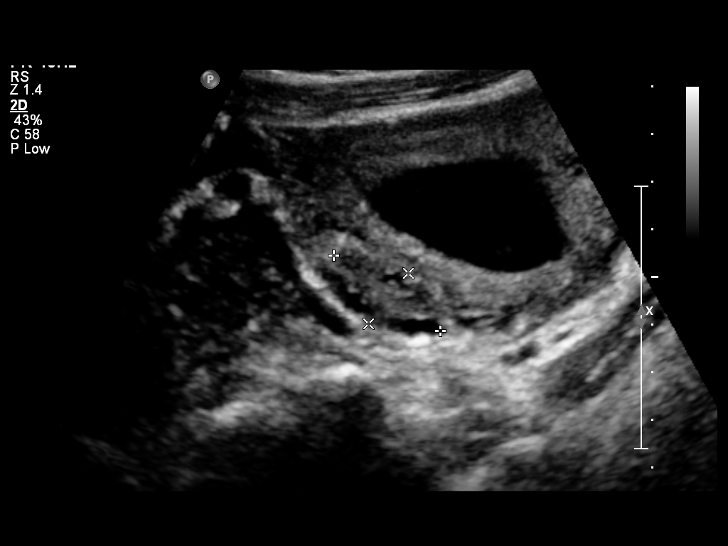
[im 19/24]
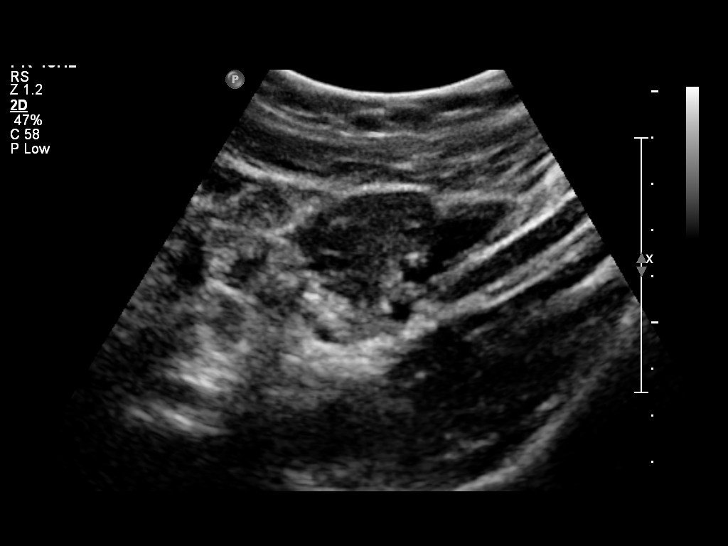
[im 20/24]
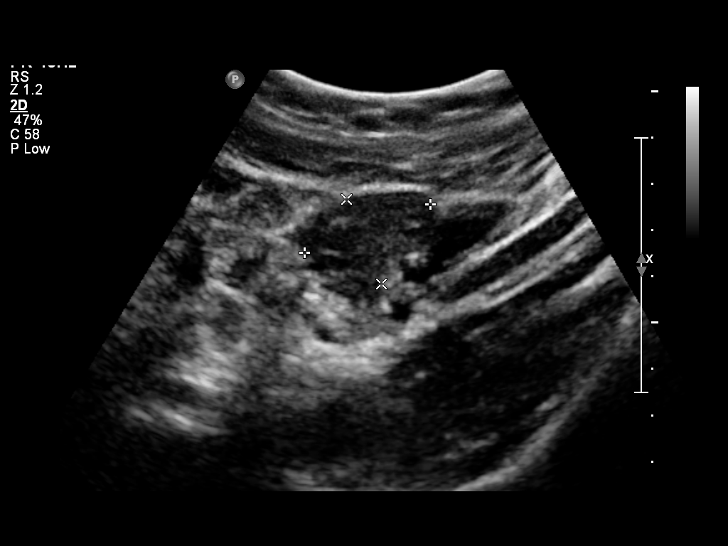
[im 22/24]
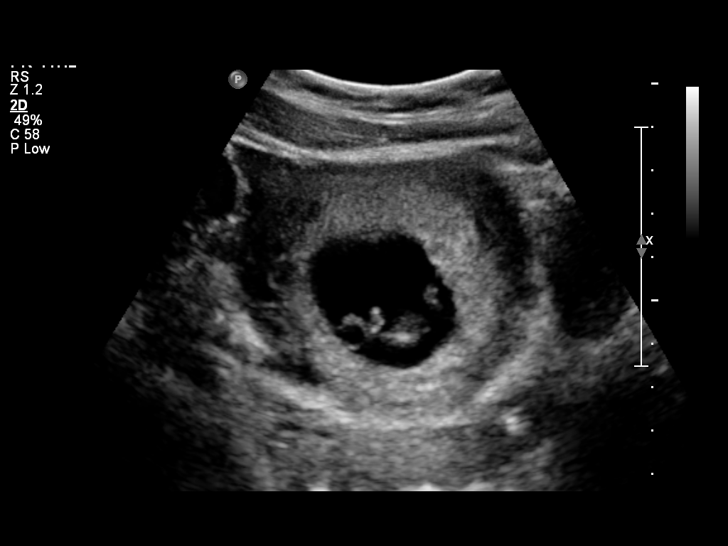
[im 24/24]
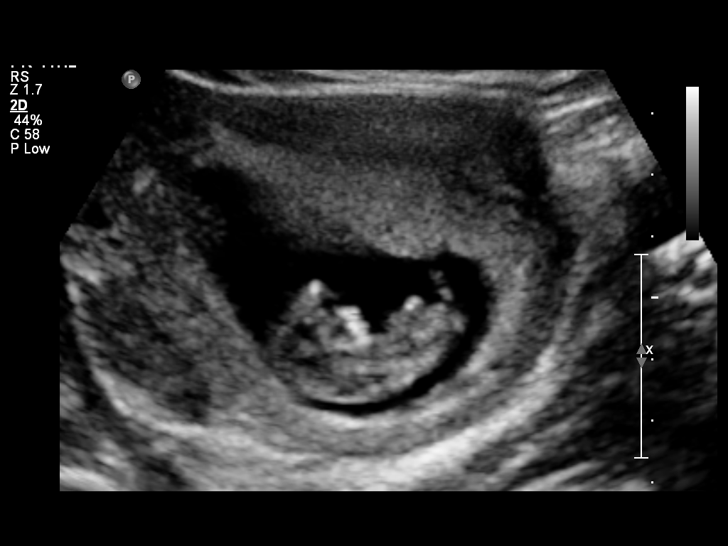

[14 of 24 positions shown; findings below may reference images not displayed]

Intrauterine gestational sac: Visualized/normal in shape.
Yolk sac: Present
Embryo: Present
Cardiac Activity: Documented
Heart Rate: 170 bpm

CRL:  35 mm  10w  3d          US EDC: 05/05/2012

Maternal uterus/Adnexae:
Normal sonographic appearance to the ovaries.  Minimal subchorionic
hemorrhage.  No free fluid.
IMPRESSION: Single intrauterine gestation with cardiac activity documented.
Estimated age of 10 weeks 3 days by crown-rump length.
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
## 2013-02-28 IMAGING — US US OB DETAIL+14 WK
1 series · 12 of 28 positions shown · non-contrast
Comparison: none

[Series 1: us ob detail +14 wk · 12 of 91 slices shown]
[im 4/91]
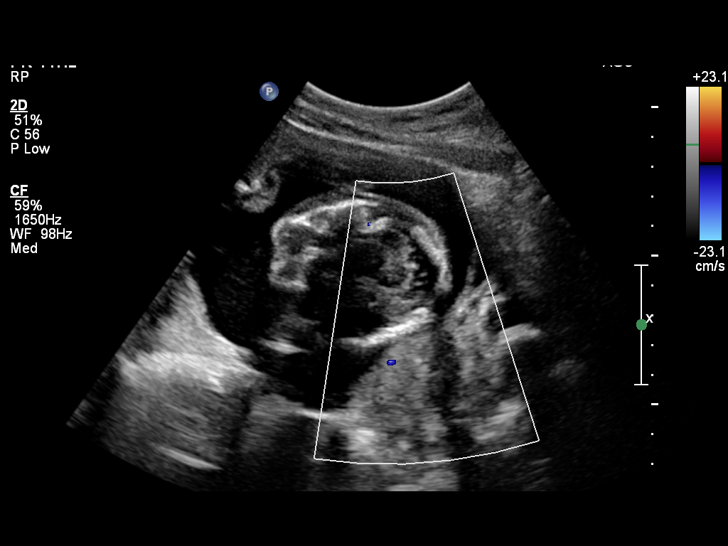
[im 11/91]
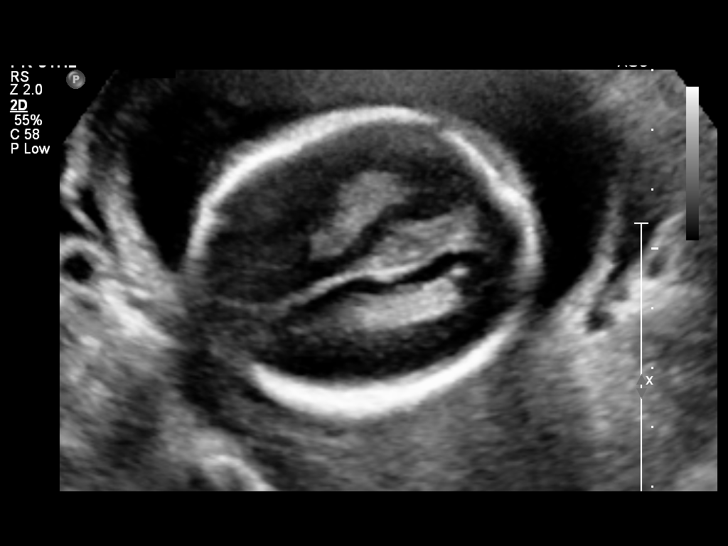
[im 17/91]
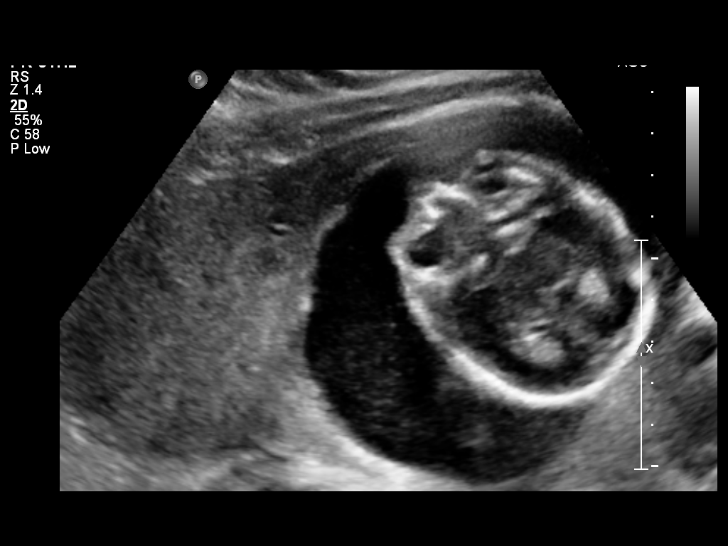
[im 27/91]
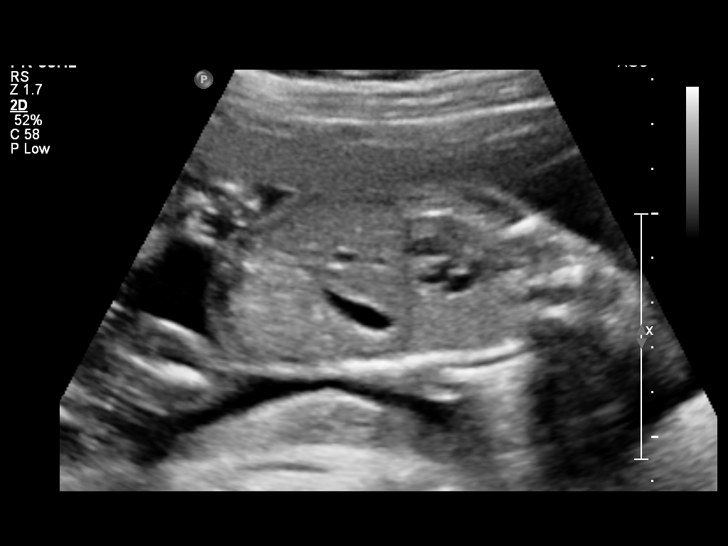
[im 34/91]
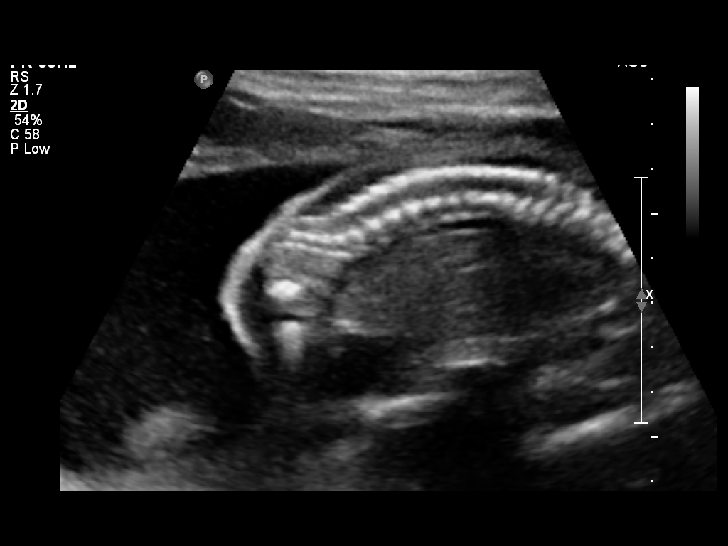
[im 41/91]
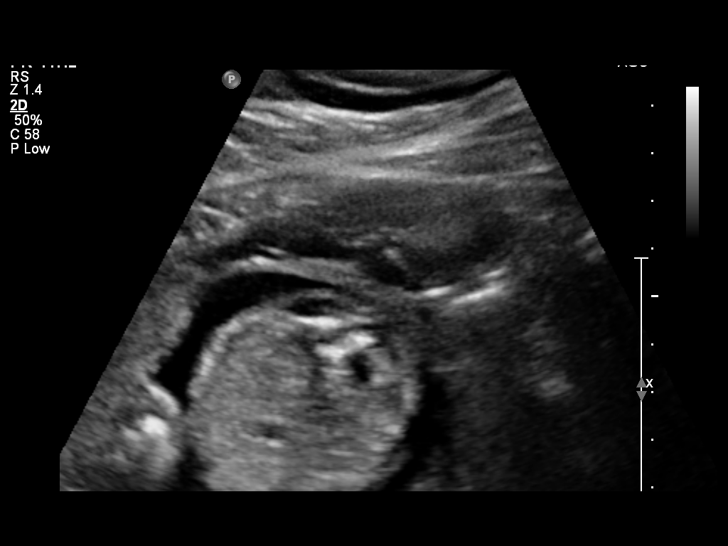
[im 51/91]
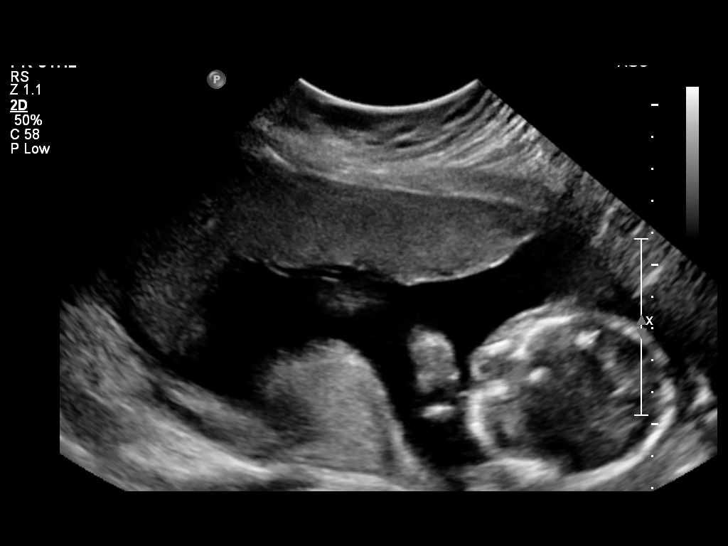
[im 57/91]
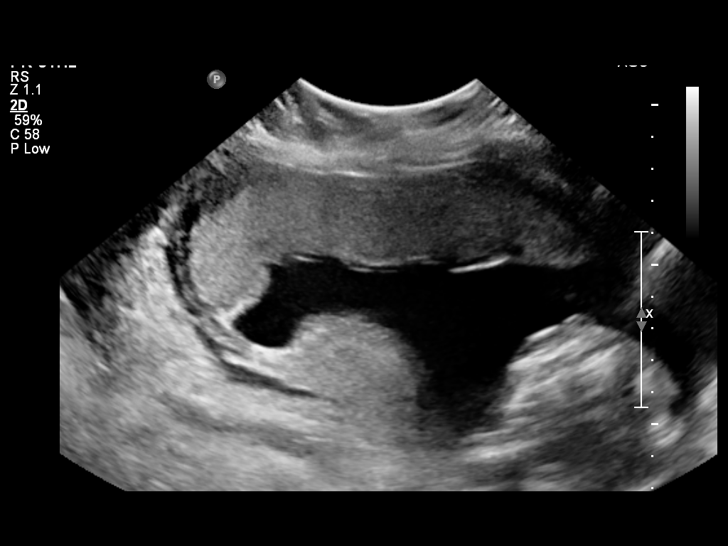
[im 64/91]
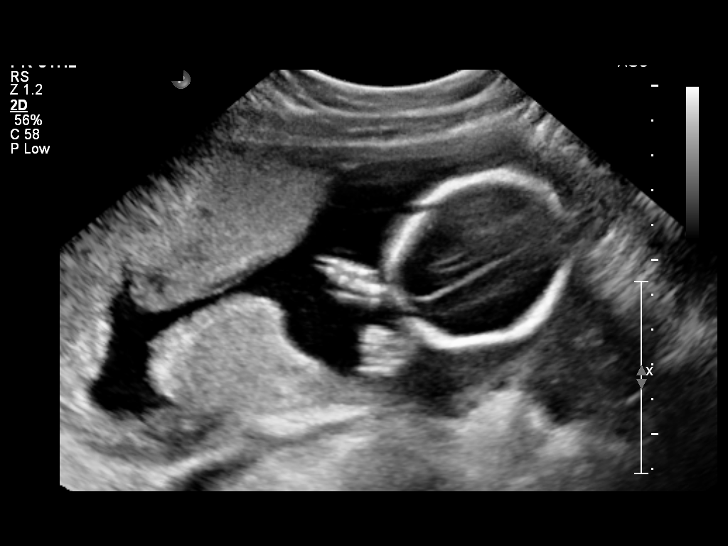
[im 74/91]
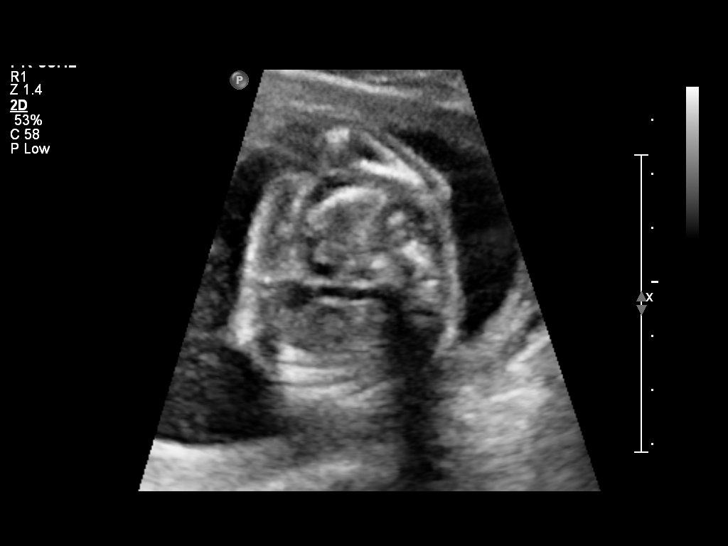
[im 81/91]
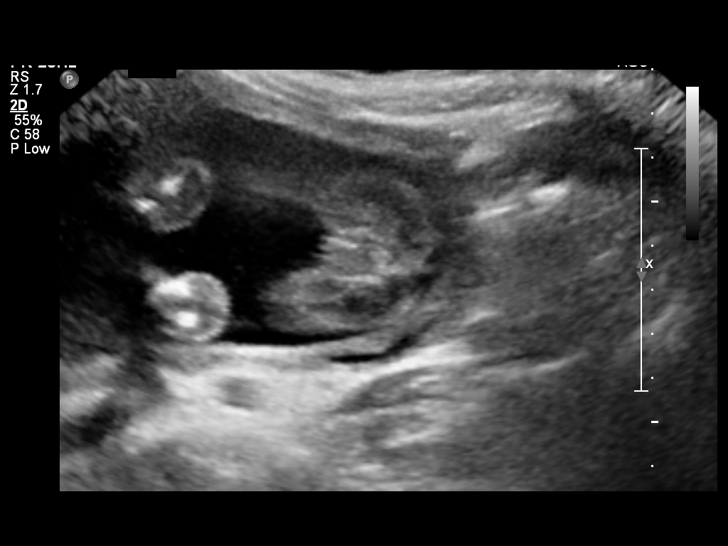
[im 87/91]
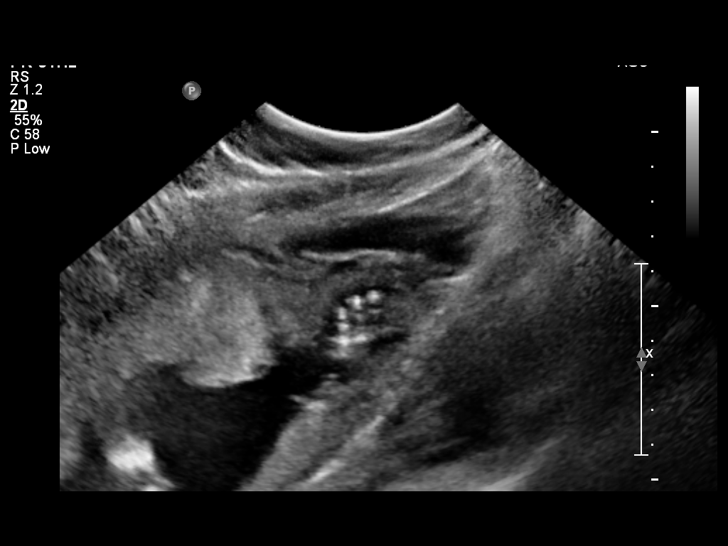

[12 of 28 positions shown; findings below may reference images not displayed]

OBSTETRICS REPORT
                      (Signed Final 12/24/2011 [DATE])

Procedures

 US OB DETAIL + 14 WK                                  76811.0
Indications

 Detailed fetal anatomic survey
 Vaginal bleeding, unknown etiology
 Pain - Abdominal/Pelvic
 No or Little Prenatal Care
 Injury to abd/pelvis; abdominal pain
Fetal Evaluation

 Fetal Heart Rate:  147                         bpm
 Cardiac Activity:  Observed
 Presentation:      Cephalic
 Placenta:          Anterior, above cervical os
 P. Cord            Visualized, central
 Insertion:

 Comment:    No evidence for abruption noted.

 Amniotic Fluid
 AFI FV:      Subjectively within normal limits
                                             Larg Pckt:   5.74   cm
Biometry

 BPD:       50  mm    G. Age:   21w 1d                CI:        70.47   70 - 86
                                                      FL/HC:      18.9   15.9 -

 HC:     189.9  mm    G. Age:   21w 2d       59  %    HC/AC:      1.16   1.06 -

 AC:     163.2  mm    G. Age:   21w 3d       60  %    FL/BPD:
 FL:      35.9  mm    G. Age:   21w 3d       59  %    FL/AC:      22.0   20 - 24
 CER:     20.3  mm    G. Age:   19w 2d       15  %
 NFT:     4.16  mm

 Est. FW:     418  gm    0 lb 15 oz      53  %
Gestational Age

 U/S Today:     21w 2d                                        EDD:   05/02/12
 Best:          20w 6d    Det. By:   Early Ultrasound         EDD:   05/05/12
Anatomy

 Cranium:           Appears normal      Aortic Arch:       Appears normal
 Fetal Cavum:       Appears normal      Ductal Arch:       Not well
                                                           visualized
 Ventricles:        Appears normal      Diaphragm:         Appears normal
 Choroid Plexus:    Appears normal      Stomach:           Appears normal
 Cerebellum:        Appears normal      Abdomen:           Appears normal
 Posterior Fossa:   Appears normal      Abdominal Wall:    Appears nml
                                                           (cord insert,
                                                           abd wall)
 Nuchal Fold:       Appears normal      Cord Vessels:      Appears normal
                    (neck, nuchal                          (3 vessel cord)
                    fold)
 Face:              Lips and orbits     Kidneys:           Appear normal
                    appear normal
 Heart:             Appears normal      Bladder:           Appears normal
                    (4 chamber &
                    axis)
 RVOT:              Not well            Spine:             Appears normal
                    visualized
 LVOT:              Appears normal      Limbs:             Appears normal
                                                           (hands, ankles,
                                                           feet)

 Other:     Heels and 5th digit visualized. Fetus appears to be a
            female. Technically difficult due to fetal position.
Cervix Uterus Adnexa

 Cervical Length:   3.16      cm

 Cervix:       Normal appearance by transabdominal scan.
 Cul De Sac:   No free fluid seen.

 Left Ovary:   Not visualized.
 Right Ovary:  Normal, measuring 3.0 x 1.9 x 1.9cm.
 Adnexa:     No abnormality visualized.
Comments

 This study was performed as an on call procedure and was
 initially reviewed by Dr. Danubis Muskus on 12/23/2011.  As this
 study was performed on call, real time assessment by a
 radiologist was not performed.
Impression

 Siup demonstrating an EGA by ultrasound of 21w 2d. This
 corresponds well with expected EGA by early ultrasound of
 20w 6d.

 No focal fetal are noted with a good anatomic evaluation
 possible. No soft markers for Down Syndrome are seen.
 Given the expected age at delivery of 25, today's normal
 ultrasound would decrease the age related risk for Down
 Syndrome from [DATE] to [DATE] (Grefg et al). Correlation
 with other aneuploidy screening results, if available, would be
 recommended for a more complete risk assessment.

 No focal placental abnormality is noted. Specifically, no
 retroplacental, preplacental or marginal abruption is noted.

 Subjectively and quantitatively normal amniotic fluid volume.
 Normal cervical length.

 questions or concerns.

## 2014-09-23 ENCOUNTER — Encounter (HOSPITAL_COMMUNITY): Payer: Self-pay | Admitting: *Deleted

## 2014-09-25 ENCOUNTER — Emergency Department (HOSPITAL_COMMUNITY)
Admission: EM | Admit: 2014-09-25 | Discharge: 2014-09-25 | Disposition: A | Discharge status: Home / Self Care | Payer: Medicaid Other | Attending: Emergency Medicine | Admitting: Emergency Medicine

## 2014-09-25 ENCOUNTER — Encounter (HOSPITAL_COMMUNITY): Payer: Self-pay | Admitting: Emergency Medicine

## 2014-09-25 DIAGNOSIS — R109 Unspecified abdominal pain: Secondary | ICD-10-CM | POA: Diagnosis present

## 2014-09-25 DIAGNOSIS — Z720 Tobacco use: Secondary | ICD-10-CM | POA: Diagnosis not present

## 2014-09-25 DIAGNOSIS — R1032 Left lower quadrant pain: Secondary | ICD-10-CM | POA: Insufficient documentation

## 2014-09-25 DIAGNOSIS — Z3202 Encounter for pregnancy test, result negative: Secondary | ICD-10-CM | POA: Insufficient documentation

## 2014-09-25 DIAGNOSIS — N760 Acute vaginitis: Secondary | ICD-10-CM | POA: Diagnosis not present

## 2014-09-25 DIAGNOSIS — B9689 Other specified bacterial agents as the cause of diseases classified elsewhere: Secondary | ICD-10-CM

## 2014-09-25 DIAGNOSIS — R110 Nausea: Secondary | ICD-10-CM | POA: Diagnosis not present

## 2014-09-25 LAB — URINALYSIS, ROUTINE W REFLEX MICROSCOPIC
BILIRUBIN URINE: NEGATIVE
Glucose, UA: NEGATIVE mg/dL
HGB URINE DIPSTICK: NEGATIVE
KETONES UR: NEGATIVE mg/dL
Leukocytes, UA: NEGATIVE
NITRITE: NEGATIVE
PH: 6 (ref 5.0–8.0)
Protein, ur: NEGATIVE mg/dL
SPECIFIC GRAVITY, URINE: 1.026 (ref 1.005–1.030)
UROBILINOGEN UA: 0.2 mg/dL (ref 0.0–1.0)

## 2014-09-25 LAB — WET PREP, GENITAL
TRICH WET PREP: NONE SEEN
Yeast Wet Prep HPF POC: NONE SEEN

## 2014-09-25 LAB — POC URINE PREG, ED: PREG TEST UR: NEGATIVE

## 2014-09-25 MED ORDER — METRONIDAZOLE 500 MG PO TABS: 500.0000 mg | ORAL_TABLET | Freq: Two times a day (BID) | ORAL | Status: DC

## 2014-09-25 NOTE — ED Notes (Signed)
patient states she is having abd cramping, states she is having some slight spotting. Patient reports increased vaginal discharge, mostly clear with slight pink tinge denies odor to discharge. Patient states she thinks she might be pregnant. Patient states abd cramping started 2-3 days ago, and has slowly increased. Patient reports increased mood swings, denies SI.

## 2014-09-25 NOTE — ED Notes (Signed)
Patient escorted to restroom to obtain urine sample.

## 2014-09-25 NOTE — ED Provider Notes (Signed)
CSN: [id number]     Arrival date & time 09/25/14  0301 History   First MD Initiated Contact with Patient 09/25/14 [phone]     Chief Complaint  Patient presents with  . Abdominal Cramping     (Consider location/radiation/quality/duration/timing/severity/associated sxs/prior Treatment) HPI Comments: Patient is a 28 year old female who presents the emergency department today for evaluation of 3 days of nausea, increased tearfulness, and abdominal cramping. She reports that these are the same symptoms she had when she was pregnant. Her last period was 09/06/2014. She has associated increase in her vaginal discharge. She had a very small amount of pink blood coming from her vagina yesterday. Her symptoms are intermittent. She denies vomiting, diarrhea, dysuria, urinary urgency, urinary frequency, fever, chills.   Patient is a 28 y.o. female presenting with cramps. The history is provided by the patient. No language interpreter was used.  Abdominal Cramping Associated symptoms include nausea. Pertinent negatives include no abdominal pain, chest pain, chills, fever or vomiting.    Past Medical History  Diagnosis Date  . No pertinent past medical history    Past Surgical History  Procedure Laterality Date  . Dilation and curettage of uterus    . Induced abortion     Family History  Problem Relation Age of Onset  . Hypertension Father   . Other Neg Hx    History  Substance Use Topics  . Smoking status: Current Every Day Smoker -- 0.25 packs/day    Types: Cigarettes  . Smokeless tobacco: Not on file  . Alcohol Use: Yes     Comment: occ   OB History    Gravida Para Term Preterm AB TAB SAB Ectopic Multiple Living   4 2 2  2 2    2      Review of Systems  Constitutional: Negative for fever and chills.  Respiratory: Negative for shortness of breath.   Cardiovascular: Negative for chest pain.  Gastrointestinal: Positive for nausea. Negative for vomiting and abdominal pain.   Genitourinary: Positive for vaginal bleeding, vaginal discharge and pelvic pain. Negative for dysuria and frequency.  All other systems reviewed and are negative.     Allergies  Review of patient's allergies indicates no known allergies.  Home Medications   Prior to Admission medications   Medication Sig Start Date End Date Taking? Authorizing Provider  acetaminophen (TYLENOL) 500 MG tablet Take 1,000 mg by mouth every 6 (six) hours as needed (for pain.).   Yes Historical Provider, MD  aspirin-acetaminophen-caffeine (EXCEDRIN MIGRAINE) [phone] MG per tablet Take 2 tablets by mouth every 8 (eight) hours as needed for headache.   Yes Historical Provider, MD   BP 105/55 mmHg  Pulse 59  Temp(Src) 98 F (36.7 C) (Oral)  Resp 20  Ht 5\' 4"  (1.626 m)  Wt 187 lb (84.823 kg)  BMI 32.08 kg/m2  SpO2 99%  LMP 09/06/2014 (Approximate) Physical Exam  Constitutional: She is oriented to person, place, and time. She appears well-developed and well-nourished. No distress.  HENT:  Head: Normocephalic and atraumatic.  Right Ear: External ear normal.  Left Ear: External ear normal.  Nose: Nose normal.  Mouth/Throat: Oropharynx is clear and moist.  Eyes: Conjunctivae are normal.  Neck: Normal range of motion.  Cardiovascular: Normal rate, regular rhythm and normal heart sounds.   Pulmonary/Chest: Effort normal and breath sounds normal. No stridor. No respiratory distress. She has no wheezes. She has no rales.  Abdominal: Soft. She exhibits no distension. There is tenderness in the left lower  quadrant. There is no rigidity, no rebound and no guarding.  Genitourinary: There is no rash, tenderness or lesion on the right labia. There is no rash, tenderness or lesion on the left labia. Cervix exhibits discharge. Cervix exhibits no motion tenderness and no friability. Right adnexum displays no mass, no tenderness and no fullness. Left adnexum displays no mass, no tenderness and no fullness. No  erythema in the vagina. No foreign body around the vagina. No signs of injury around the vagina. Vaginal discharge found.  Thin white discharge   Musculoskeletal: Normal range of motion.  Neurological: She is alert and oriented to person, place, and time. She has normal strength.  Skin: Skin is warm and dry. She is not diaphoretic. No erythema.  Psychiatric: She has a normal mood and affect. Her behavior is normal.  Nursing note and vitals reviewed.   ED Course  Procedures (including critical care time) Labs Review Labs Reviewed  WET PREP, GENITAL - Abnormal; Notable for the following:    Clue Cells Wet Prep HPF POC RARE (*)    WBC, Wet Prep HPF POC FEW (*)    All other components within normal limits  URINALYSIS, ROUTINE W REFLEX MICROSCOPIC - Abnormal; Notable for the following:    APPearance CLOUDY (*)    All other components within normal limits  GC/CHLAMYDIA PROBE AMP  POC URINE PREG, ED    Imaging Review No results found.   EKG Interpretation None      MDM   Final diagnoses:  Abdominal cramping  Bacterial vaginosis    Patient presents to ED for evaluation of suprapubic cramping and increased vaginal discharge. Negative pregnancy test. Discussed with patient it is possible she has a false negative pregnancy test and to look for missed periods. Few clue cells. Given increase in vaginal discharge will treat for BV. Discussed reasons to return to ED immediately. Vital signs stable for discharge. Patient / Family / Caregiver informed of clinical course, understand medical decision-making process, and agree with plan.     Mora BellmanHannah S Romina Divirgilio, PA-C 09/25/14 [phone]  Derwood KaplanAnkit Nanavati, MD 09/25/14 2245

## 2014-09-25 NOTE — Discharge Instructions (Signed)
Abdominal Pain, Women °Abdominal (stomach, pelvic, or belly) pain can be caused by many things. It is important to tell your doctor: °· The location of the pain. °· Does it come and go or is it present all the time? °· Are there things that start the pain (eating certain foods, exercise)? °· Are there other symptoms associated with the pain (fever, nausea, vomiting, diarrhea)? °All of this is helpful to know when trying to find the cause of the pain. °CAUSES  °· Stomach: virus or bacteria infection, or ulcer. °· Intestine: appendicitis (inflamed appendix), regional ileitis (Crohn's disease), ulcerative colitis (inflamed colon), irritable bowel syndrome, diverticulitis (inflamed diverticulum of the colon), or cancer of the stomach or intestine. °· Gallbladder disease or stones in the gallbladder. °· Kidney disease, kidney stones, or infection. °· Pancreas infection or cancer. °· Fibromyalgia (pain disorder). °· Diseases of the female organs: °· Uterus: fibroid (non-cancerous) tumors or infection. °· Fallopian tubes: infection or tubal pregnancy. °· Ovary: cysts or tumors. °· Pelvic adhesions (scar tissue). °· Endometriosis (uterus lining tissue growing in the pelvis and on the pelvic organs). °· Pelvic congestion syndrome (female organs filling up with blood just before the menstrual period). °· Pain with the menstrual period. °· Pain with ovulation (producing an egg). °· Pain with an IUD (intrauterine device, birth control) in the uterus. °· Cancer of the female organs. °· Functional pain (pain not caused by a disease, may improve without treatment). °· Psychological pain. °· Depression. °DIAGNOSIS  °Your doctor will decide the seriousness of your pain by doing an examination. °· Blood tests. °· X-rays. °· Ultrasound. °· CT scan (computed tomography, special type of X-ray). °· MRI (magnetic resonance imaging). °· Cultures, for infection. °· Barium enema (dye inserted in the large intestine, to better view it with  X-rays). °· Colonoscopy (looking in intestine with a lighted tube). °· Laparoscopy (minor surgery, looking in abdomen with a lighted tube). °· Major abdominal exploratory surgery (looking in abdomen with a large incision). °TREATMENT  °The treatment will depend on the cause of the pain.  °· Many cases can be observed and treated at home. °· Over-the-counter medicines recommended by your caregiver. °· Prescription medicine. °· Antibiotics, for infection. °· Birth control pills, for painful periods or for ovulation pain. °· Hormone treatment, for endometriosis. °· Nerve blocking injections. °· Physical therapy. °· Antidepressants. °· Counseling with a psychologist or psychiatrist. °· Minor or major surgery. °HOME CARE INSTRUCTIONS  °· Do not take laxatives, unless directed by your caregiver. °· Take over-the-counter pain medicine only if ordered by your caregiver. Do not take aspirin because it can cause an upset stomach or bleeding. °· Try a clear liquid diet (broth or water) as ordered by your caregiver. Slowly move to a bland diet, as tolerated, if the pain is related to the stomach or intestine. °· Have a thermometer and take your temperature several times a day, and record it. °· Bed rest and sleep, if it helps the pain. °· Avoid sexual intercourse, if it causes pain. °· Avoid stressful situations. °· Keep your follow-up appointments and tests, as your caregiver orders. °· If the pain does not go away with medicine or surgery, you may try: °¨ Acupuncture. °¨ Relaxation exercises (yoga, meditation). °¨ Group therapy. °¨ Counseling. °SEEK MEDICAL CARE IF:  °· You notice certain foods cause stomach pain. °· Your home care treatment is not helping your pain. °· You need stronger pain medicine. °· You want your IUD removed. °· You feel faint or   lightheaded. °· You develop nausea and vomiting. °· You develop a rash. °· You are having side effects or an allergy to your medicine. °SEEK IMMEDIATE MEDICAL CARE IF:  °· Your  pain does not go away or gets worse. °· You have a fever. °· Your pain is felt only in portions of the abdomen. The right side could possibly be appendicitis. The left lower portion of the abdomen could be colitis or diverticulitis. °· You are passing blood in your stools (bright red or black tarry stools, with or without vomiting). °· You have blood in your urine. °· You develop chills, with or without a fever. °· You pass out. °MAKE SURE YOU:  °· Understand these instructions. °· Will watch your condition. °· Will get help right away if you are not doing well or get worse. °Document Released: 09/05/2007 Document Revised: 03/25/2014 Document Reviewed: 09/25/2009 °[employer]® Patient Information ©2015 [employer]. This information is not intended to replace advice given to you by your health care provider. Make sure you discuss any questions you have with your health care provider. °Bacterial Vaginosis °Bacterial vaginosis is an infection of the vagina. It happens when too many of certain germs (bacteria) grow in the vagina. °HOME CARE °· Take your medicine as told by your doctor. °· Finish your medicine even if you start to feel better. °· Do not have sex until you finish your medicine and are better. °· Tell your sex partner that you have an infection. They should see their doctor for treatment. °· Practice safe sex. Use condoms. Have only one sex partner. °GET HELP IF: °· You are not getting better after 3 days of treatment. °· You have more grey fluid (discharge) coming from your vagina than before. °· You have more pain than before. °· You have a fever. °MAKE SURE YOU:  °· Understand these instructions. °· Will watch your condition. °· Will get help right away if you are not doing well or get worse. °Document Released: 08/17/2008 Document Revised: 08/29/2013 Document Reviewed: 06/20/2013 °[employer]® Patient Information ©2015 [employer]. This information is not intended to replace advice given to you by your  health care provider. Make sure you discuss any questions you have with your health care provider. ° °

## 2014-09-26 LAB — GC/CHLAMYDIA PROBE AMP
CT PROBE, AMP APTIMA: NEGATIVE
GC PROBE AMP APTIMA: NEGATIVE

## 2014-12-01 ENCOUNTER — Emergency Department (HOSPITAL_COMMUNITY)
Admission: EM | Admit: 2014-12-01 | Discharge: 2014-12-01 | Discharge status: Against Medical Advice | Payer: Medicaid Other | Attending: Emergency Medicine | Admitting: Emergency Medicine

## 2014-12-01 ENCOUNTER — Encounter (HOSPITAL_COMMUNITY): Payer: Self-pay | Admitting: Adult Health

## 2014-12-01 DIAGNOSIS — Z792 Long term (current) use of antibiotics: Secondary | ICD-10-CM | POA: Diagnosis not present

## 2014-12-01 DIAGNOSIS — Z720 Tobacco use: Secondary | ICD-10-CM | POA: Insufficient documentation

## 2014-12-01 DIAGNOSIS — Z3202 Encounter for pregnancy test, result negative: Secondary | ICD-10-CM | POA: Insufficient documentation

## 2014-12-01 DIAGNOSIS — R000 Tachycardia, unspecified: Secondary | ICD-10-CM | POA: Insufficient documentation

## 2014-12-01 DIAGNOSIS — N939 Abnormal uterine and vaginal bleeding, unspecified: Secondary | ICD-10-CM | POA: Insufficient documentation

## 2014-12-01 LAB — BASIC METABOLIC PANEL WITH GFR
Anion gap: 14 (ref 5–15)
BUN: 6 mg/dL (ref 6–23)
CO2: 21 mmol/L (ref 19–32)
Calcium: 9.5 mg/dL (ref 8.4–10.5)
Chloride: 103 meq/L (ref 96–112)
Creatinine, Ser: 0.86 mg/dL (ref 0.50–1.10)
GFR calc Af Amer: >90 mL/min
GFR calc non Af Amer: >90 mL/min
Glucose, Bld: 86 mg/dL (ref 70–99)
Potassium: 3.1 mmol/L — ABNORMAL LOW (ref 3.5–5.1)
Sodium: 138 mmol/L (ref 135–145)

## 2014-12-01 LAB — PREGNANCY, URINE: PREG TEST UR: NEGATIVE

## 2014-12-01 LAB — URINE MICROSCOPIC-ADD ON

## 2014-12-01 LAB — CBC
HCT: 31.9 % — ABNORMAL LOW (ref 36.0–46.0)
HEMOGLOBIN: 10 g/dL — AB (ref 12.0–15.0)
MCH: 22.1 pg — ABNORMAL LOW (ref 26.0–34.0)
MCHC: 31.3 g/dL (ref 30.0–36.0)
MCV: 70.4 fL — ABNORMAL LOW (ref 78.0–100.0)
Platelets: 271 10*3/uL (ref 150–400)
RBC: 4.53 MIL/uL (ref 3.87–5.11)
RDW: 14.7 % (ref 11.5–15.5)
WBC: 11.2 10*3/uL — AB (ref 4.0–10.5)

## 2014-12-01 LAB — URINALYSIS, ROUTINE W REFLEX MICROSCOPIC
Bilirubin Urine: NEGATIVE
GLUCOSE, UA: NEGATIVE mg/dL
Ketones, ur: NEGATIVE mg/dL
Nitrite: NEGATIVE
PH: 6 (ref 5.0–8.0)
Protein, ur: 30 mg/dL — AB
SPECIFIC GRAVITY, URINE: 1.019 (ref 1.005–1.030)
Urobilinogen, UA: 1 mg/dL (ref 0.0–1.0)

## 2014-12-01 NOTE — ED Notes (Signed)
Presents with abnormal vaginal bleeding-she reports having a normal period on the 7th of decemebr and then Dec 31 she started again, this time menstrual cycle has been light but has not stopped, last night reports having a large glob of blood in the shower and intermittent bleeding. Denies cramps.

## 2014-12-01 NOTE — ED Notes (Signed)
Pt let without telling anyone or singing esignature

## 2014-12-01 NOTE — ED Provider Notes (Signed)
CSN: [id number]     Arrival date & time 12/01/14  2101 History   First MD Initiated Contact with Patient 12/01/14 2115     Chief Complaint  Patient presents with  . Vaginal Bleeding     (Consider location/radiation/quality/duration/timing/severity/associated sxs/prior Treatment) The history is provided by the patient and medical records. No language interpreter was used.    Christine Hudson is a 29 y.o. female  with no major medical problems presents to the Emergency Department complaining of gradual, persistent, progressively worsening pink/brown vaginal bleeding onset 11 days ago requiring 2 panty liners per day.  Pt reports several intermittent episodes of cramping in the right pelvis, not present at this time.  Last full menses on Dec 7th, 2015.  No associated symptoms, no aggravating or alleviating factors.  He denies fever, chills, headache, neck pain, chest pain, shortness of breath, nausea, vomiting, diarrhea, weakness, dizziness, syncope, dysuria.   Past Medical History  Diagnosis Date  . No pertinent past medical history    Past Surgical History  Procedure Laterality Date  . Dilation and curettage of uterus    . Induced abortion     Family History  Problem Relation Age of Onset  . Hypertension Father   . Other Neg Hx    History  Substance Use Topics  . Smoking status: Current Every Day Smoker -- 0.25 packs/day    Types: Cigarettes  . Smokeless tobacco: Not on file  . Alcohol Use: Yes     Comment: occ   OB History    Gravida Para Term Preterm AB TAB SAB Ectopic Multiple Living   Review of Systems  Constitutional: Negative for fever, diaphoresis, appetite change, fatigue and unexpected weight change.  HENT: Negative for mouth sores and trouble swallowing.   Eyes: Negative for visual disturbance.  Respiratory: Negative for cough, chest tightness, shortness of breath, wheezing and stridor.   Cardiovascular: Negative for chest pain and  palpitations.  Gastrointestinal: Positive for abdominal pain ( intermittent, not currently present). Negative for nausea, vomiting, diarrhea, constipation, blood in stool, abdominal distention and rectal pain.  Endocrine: Negative for polydipsia, polyphagia and polyuria.  Genitourinary: Positive for menstrual problem. Negative for dysuria, urgency, frequency, hematuria, flank pain and difficulty urinating.  Musculoskeletal: Negative for back pain, neck pain and neck stiffness.  Skin: Negative for rash.  Allergic/Immunologic: Negative for immunocompromised state.  Neurological: Negative for syncope, weakness, light-headedness and headaches.  Hematological: Negative for adenopathy. Does not bruise/bleed easily.  Psychiatric/Behavioral: Negative for confusion and sleep disturbance. The patient is not nervous/anxious.   All other systems reviewed and are negative.     Allergies  Review of patient's allergies indicates no known allergies.  Home Medications   Prior to Admission medications   Medication Sig Start Date End Date Taking? Authorizing Provider  acetaminophen (TYLENOL) 500 MG tablet Take 1,000 mg by mouth every 6 (six) hours as needed (for pain.).    Historical Provider, MD  aspirin-acetaminophen-caffeine (EXCEDRIN MIGRAINE) [phone] MG per tablet Take 2 tablets by mouth every 8 (eight) hours as needed for headache.    Historical Provider, MD  metroNIDAZOLE (FLAGYL) 500 MG tablet Take 1 tablet (500 mg total) by mouth 2 (two) times daily. One po bid x 7 days 09/25/14   Mora Bellman, PA-C   BP 151/77 mmHg  Pulse 107  Temp(Src) 99.8 F (37.7 C) (Oral)  Resp 16  Ht  (1.6 m)  Wt  179 lb 1 oz (81.222 kg)  BMI 31.73 kg/m2  SpO2 97%  LMP 11/21/2014 Physical Exam  Constitutional: She appears well-developed and well-nourished. No distress.  Awake, alert, nontoxic appearance  HENT:  Head: Normocephalic and atraumatic.  Mouth/Throat: Oropharynx is clear and moist. No  oropharyngeal exudate.  Eyes: Conjunctivae are normal. No scleral icterus.  Neck: Normal range of motion. Neck supple.  Cardiovascular: Regular rhythm, normal heart sounds and intact distal pulses.   Mild tachycardia  Pulmonary/Chest: Effort normal and breath sounds normal. No respiratory distress. She has no wheezes.  Equal chest expansion  Abdominal: Soft. Bowel sounds are normal. She exhibits no distension and no mass. There is no tenderness. There is no rebound and no guarding.  Soft and nontender No CVA tenderness  Musculoskeletal: Normal range of motion. She exhibits no edema.  Neurological: She is alert.  Speech is clear and goal oriented Moves extremities without ataxia  Skin: Skin is warm and dry. She is not diaphoretic.  Psychiatric: She has a normal mood and affect.  Nursing note and vitals reviewed.   ED Course  Procedures (including critical care time) Labs Review Labs Reviewed  CBC - Abnormal; Notable for the following:    WBC 11.2 (*)    Hemoglobin 10.0 (*)    HCT 31.9 (*)    MCV 70.4 (*)    MCH 22.1 (*)    All other components within normal limits  BASIC METABOLIC PANEL - Abnormal; Notable for the following:    Potassium 3.1 (*)    All other components within normal limits  URINALYSIS, ROUTINE W REFLEX MICROSCOPIC - Abnormal; Notable for the following:    Hgb urine dipstick LARGE (*)    Protein, ur 30 (*)    Leukocytes, UA SMALL (*)    All other components within normal limits  URINE MICROSCOPIC-ADD ON - Abnormal; Notable for the following:    Squamous Epithelial / LPF FEW (*)    Bacteria, UA MANY (*)    All other components within normal limits  WET PREP, GENITAL  GC/CHLAMYDIA PROBE AMP  URINE CULTURE  PREGNANCY, URINE    Imaging Review No results found.   EKG Interpretation None      MDM   Final diagnoses:  Vaginal bleeding   Christine Hudson presents of mild vaginal bleeding.  Abdomen soft and nontender. Will obtain labs, pregnancy test, UA  and obtain pelvic exam.  10:26 PM Pregnancy test negative. UA with small leuks, 11-20 white blood cells and too numerous to count red blood cells with many bacteria. Question possible UTI however patient denies all urinary symptoms.  CBC with mild anemia at 10.0, unknown baseline. White blood cell count 11.2. Potassium 3.1.  10:47 PM Intra-patient room to complete assessment and perform pelvic exam. There is no one in the room and patient's gown is lying on the bed. Nursing secretary questions unknown has seen her leave the room. Patient was given the results of her pregnancy test but not of her lab work prior to her elopement from the emergency department.  No evidence of ectopic pregnancy.  Pt was informed that she was going to need an OB/GYN follow-up prior to her elopement from the ED.  BP 151/77 mmHg  Pulse 107  Temp(Src) 99.8 F (37.7 C) (Oral)  Resp 16  Ht 5\' 3"  (1.6 m)  Wt 179 lb 1 oz (81.222 kg)  BMI 31.73 kg/m2  SpO2 97%  LMP 11/21/2014   Dierdre ForthHannah Copelan Maultsby, PA-C 12/01/14 2306  Juliet RudeNathan R.  Rubin Payor, MD 12/02/14 1610

## 2017-05-08 ENCOUNTER — Inpatient Hospital Stay (HOSPITAL_COMMUNITY)
Admission: AD | Admit: 2017-05-08 | Discharge: 2017-05-08 | Discharge status: Against Medical Advice | Payer: Medicaid Other | Source: Ambulatory Visit | Attending: Obstetrics & Gynecology | Admitting: Obstetrics & Gynecology

## 2017-05-08 DIAGNOSIS — Z3202 Encounter for pregnancy test, result negative: Secondary | ICD-10-CM | POA: Insufficient documentation

## 2017-05-08 LAB — POCT PREGNANCY, URINE: PREG TEST UR: NEGATIVE

## 2017-05-08 NOTE — MAU Note (Signed)
Pt not in lobby x2 

## 2017-05-08 NOTE — MAU Note (Signed)
Pt not in lobby.  

## 2017-05-08 NOTE — MAU Note (Signed)
Pt not in lobby x3 

## 2017-05-08 NOTE — ED Notes (Signed)
Urine in lab 

## 2017-06-12 ENCOUNTER — Encounter (HOSPITAL_COMMUNITY): Payer: Self-pay | Admitting: Emergency Medicine

## 2017-06-12 ENCOUNTER — Emergency Department (HOSPITAL_COMMUNITY)
Admission: EM | Admit: 2017-06-12 | Discharge: 2017-06-12 | Disposition: A | Discharge status: Home / Self Care | Payer: Medicaid Other | Attending: Emergency Medicine | Admitting: Emergency Medicine

## 2017-06-12 DIAGNOSIS — R0982 Postnasal drip: Secondary | ICD-10-CM | POA: Insufficient documentation

## 2017-06-12 DIAGNOSIS — T162XXA Foreign body in left ear, initial encounter: Secondary | ICD-10-CM

## 2017-06-12 DIAGNOSIS — R0981 Nasal congestion: Secondary | ICD-10-CM | POA: Insufficient documentation

## 2017-06-12 DIAGNOSIS — F17210 Nicotine dependence, cigarettes, uncomplicated: Secondary | ICD-10-CM | POA: Insufficient documentation

## 2017-06-12 MED ORDER — FLUTICASONE PROPIONATE 50 MCG/ACT NA SUSP: 1.0000 | Freq: Every day | NASAL | 0 refills | Status: DC

## 2017-06-12 MED ORDER — CETIRIZINE HCL 10 MG PO TABS: 10.0000 mg | ORAL_TABLET | Freq: Every day | ORAL | 0 refills | Status: DC

## 2017-06-12 NOTE — Discharge Instructions (Signed)
As discussed, your ear canal is free of any foreign bodies today. For your postnasal drip and allergy symptoms he may use antihistamine such as Zyrtec or Claritin with nasal spray such as Flonase. Stay well hydrated and follow-up as needed with your primary care provider if symptoms persist.  Return if you experience any new concerning symptoms in the meantime.

## 2017-06-12 NOTE — ED Triage Notes (Signed)
Pt from home with c/o cotton from q-tip stuck in left ear. This RN looked into pt's ear and did not see any foreign bodies no evidence of trauma. Pt also states she has sinus congestion causing a 1/10 headache. Pt states she wants to ensure it is secondary to sinus congestion. Pt has no other complaints of congestion or discomfort. Pt has clear lung sounds

## 2017-06-13 NOTE — ED Provider Notes (Signed)
[facility]-EMERGENCY DEPT Provider Note   CSN: [id number] Arrival date & time: 06/12/17  2003     History   Chief Complaint Chief Complaint  Patient presents with  . Foreign Body in Ear  . Nasal Congestion    HPI Nida Boatmanmber Luby is a 31 y.o. female presenting with sensation of left foreign body in left ear from a Q-tip earlier today. She thought she may have a piece of the cotton left in the right ear. She denies any pain. She also inquired about what to do for allergies and postnasal drip. She denies any fever, chills, or other symptoms. HPI  Past Medical History:  Diagnosis Date  . No pertinent past medical history     There are no active problems to display for this patient.   Past Surgical History:  Procedure Laterality Date  . DILATION AND CURETTAGE OF UTERUS    . INDUCED ABORTION      OB History    Gravida Para Term Preterm AB Living   4 2 2   2 2    SAB TAB Ectopic Multiple Live Births     2     2       Home Medications    Prior to Admission medications   Medication Sig Start Date End Date Taking? Authorizing Provider  acetaminophen (TYLENOL) 500 MG tablet Take 1,000 mg by mouth every 6 (six) hours as needed (for pain.).    [provider]  aspirin-acetaminophen-caffeine (EXCEDRIN MIGRAINE) [phone] MG per tablet Take 2 tablets by mouth every 8 (eight) hours as needed for headache.    [provider]  cetirizine (ZYRTEC ALLERGY) 10 MG tablet Take 1 tablet (10 mg total) by mouth daily. 06/12/17   Mathews RobinsonsMitchell, Karlyn Glasco B, PA-C  fluticasone (FLONASE) 50 MCG/ACT nasal spray Place 1 spray into both nostrils daily. 06/12/17   Georgiana ShoreMitchell, Elisabella Hacker B, PA-C  metroNIDAZOLE (FLAGYL) 500 MG tablet Take 1 tablet (500 mg total) by mouth 2 (two) times daily. One po bid x 7 days 09/25/14   Junious SilkMerrell, Hannah, PA-C    Family History Family History  Problem Relation Age of Onset  . Hypertension Father   . Other Neg Hx     Social History Social History  Substance Use  Topics  . Smoking status: Current Every Day Smoker    Packs/day: 0.25    Types: Cigarettes  . Smokeless tobacco: Not on file  . Alcohol use Yes     Comment: occ     Allergies   Patient has no known allergies.   Review of Systems Review of Systems  Constitutional: Negative for chills and fever.  HENT: Positive for postnasal drip and sneezing. Negative for ear pain, sinus pain, sinus pressure, sore throat and trouble swallowing.   Respiratory: Negative for chest tightness, shortness of breath, wheezing and stridor.   Gastrointestinal: Negative for nausea and vomiting.  Musculoskeletal: Negative for neck pain and neck stiffness.  Skin: Negative for color change and pallor.     Physical Exam Updated Vital Signs BP 119/60   Temp 98.7 F (37.1 C) (Oral)   Physical Exam  Constitutional: She appears well-developed and well-nourished. No distress.  Afebrile, nontoxic-appearing, sitting comfortably in chair in no acute distress.  HENT:  Head: Normocephalic and atraumatic.  Right Ear: Tympanic membrane, external ear and ear canal normal. No foreign bodies.  Left Ear: Tympanic membrane, external ear and ear canal normal. No foreign bodies.  Mouth/Throat: Oropharynx is clear and moist. No oropharyngeal exudate.  Eyes: Conjunctivae  and EOM are normal. Right eye exhibits no discharge. Left eye exhibits no discharge.  Neck: Neck supple.  Cardiovascular: Normal rate, regular rhythm and normal heart sounds.   No murmur heard. Pulmonary/Chest: Effort normal and breath sounds normal. No respiratory distress. She has no wheezes. She has no rales.  Abdominal: She exhibits no distension.  Musculoskeletal: Normal range of motion. She exhibits no edema.  Lymphadenopathy:    She has no cervical adenopathy.  Neurological: She is alert.  Skin: Skin is warm and dry. No rash noted. She is not diaphoretic. No erythema. No pallor.  Psychiatric: She has a normal mood and affect.  Nursing note and  vitals reviewed.    ED Treatments / Results  Labs (all labs ordered are listed, but only abnormal results are displayed) Labs Reviewed - No data to display  EKG  EKG Interpretation None       Radiology No results found.  Procedures Procedures (including critical care time)  Medications Ordered in ED Medications - No data to display   Initial Impression / Assessment and Plan / ED Course  I have reviewed the triage vital signs and the nursing notes.  Pertinent labs & imaging results that were available during my care of the patient were reviewed by me and considered in my medical decision making (see chart for details).    Patient presented for evaluation of potential foreign body in her left ear canal. On exam there is no foreign body and normal tympanic membranes.   She also inquired about for allergies and postnasal drip.  Will discharge home with antihistamine and Flonase with follow-up with PCP as needed.  Patient was well-appearing with no other complaints prior to discharge.  Final Clinical Impressions(s) / ED Diagnoses   Final diagnoses:  Foreign body of left ear, initial encounter    New Prescriptions Discharge Medication List as of 06/12/2017  9:47 PM    START taking these medications   Details  cetirizine (ZYRTEC ALLERGY) 10 MG tablet Take 1 tablet (10 mg total) by mouth daily., Starting Sun 06/12/2017, Print    fluticasone (FLONASE) 50 MCG/ACT nasal spray Place 1 spray into both nostrils daily., Starting Sun 06/12/2017, Print         Georgiana Shore, PA-C 06/13/17 7846    Raeford Razor, MD 06/19/17 1128

## 2020-02-13 ENCOUNTER — Emergency Department
Admission: EM | Admit: 2020-02-13 | Discharge: 2020-02-13 | Disposition: A | Discharge status: Home / Self Care | Payer: Medicaid Other | Attending: Emergency Medicine | Admitting: Emergency Medicine

## 2020-02-13 ENCOUNTER — Encounter: Payer: Self-pay | Admitting: Emergency Medicine

## 2020-02-13 ENCOUNTER — Other Ambulatory Visit: Payer: Self-pay

## 2020-02-13 DIAGNOSIS — F17210 Nicotine dependence, cigarettes, uncomplicated: Secondary | ICD-10-CM | POA: Insufficient documentation

## 2020-02-13 DIAGNOSIS — J020 Streptococcal pharyngitis: Secondary | ICD-10-CM | POA: Insufficient documentation

## 2020-02-13 DIAGNOSIS — J029 Acute pharyngitis, unspecified: Secondary | ICD-10-CM | POA: Diagnosis present

## 2020-02-13 LAB — GROUP A STREP BY PCR: Group A Strep by PCR: DETECTED — AB

## 2020-02-13 MED ORDER — ACETAMINOPHEN 325 MG PO TABS
650.0000 mg | ORAL_TABLET | Freq: Once | ORAL | Status: AC
  Administered 2020-02-13: 650 mg via ORAL
  Filled 2020-02-13: qty 2

## 2020-02-13 MED ORDER — PENICILLIN G BENZATHINE 1200000 UNIT/2ML IM SUSP
1.2000 10*6.[IU] | Freq: Once | INTRAMUSCULAR | Status: AC
  Administered 2020-02-13: 1.2 10*6.[IU] via INTRAMUSCULAR
  Filled 2020-02-13: qty 2

## 2020-02-13 NOTE — Discharge Instructions (Addendum)
You had been tested and treated empirically for strep pharyngitis.  You been given a single dose of penicillin as an injection in the ED.  Continue to take Tylenol and Motrin for fevers as needed. Change your toothbrush head in 24 hours. You may take over-the-counter decongestants for additional sinus pressure relief.  Follow-up with your primary provider return to the ED for worsening symptoms.

## 2020-02-13 NOTE — ED Triage Notes (Signed)
Pt reports sore throat for the last 2-3 days and now her left ear is hurting. Pt reports unsure of fevers but has felt hot.

## 2020-02-13 NOTE — ED Provider Notes (Addendum)
[facility] Emergency Department Provider Note ____________________________________________  Time seen: 1310  I have reviewed the triage vital signs and the nursing notes.  HISTORY  Chief Complaint  Sore Throat and Otalgia  HPI Christine Hudson is a 34 y.o. female presents to the ED with a 2 to 3-day complaint of sore throat.  She admits to a close exposure to strep over the last few days.  Patient also describes some left ear pain.  She denies any frank fevers, but has felt warm subjectively.  She denies any vertigo, tinnitus, or hearing loss. She has been taking Advil for symptom relief.   Past Medical History:  Diagnosis Date  . No pertinent past medical history     There are no problems to display for this patient.   Past Surgical History:  Procedure Laterality Date  . DILATION AND CURETTAGE OF UTERUS    . INDUCED ABORTION      Prior to Admission medications   Medication Sig Start Date End Date Taking? Authorizing Provider  acetaminophen (TYLENOL) 500 MG tablet Take 1,000 mg by mouth every 6 (six) hours as needed (for pain.).    [provider]  aspirin-acetaminophen-caffeine (EXCEDRIN MIGRAINE) [phone] MG per tablet Take 2 tablets by mouth every 8 (eight) hours as needed for headache.    [provider]  cetirizine (ZYRTEC ALLERGY) 10 MG tablet Take 1 tablet (10 mg total) by mouth daily. 06/12/17 02/13/20  Mathews Robinsons B, PA-C  fluticasone (FLONASE) 50 MCG/ACT nasal spray Place 1 spray into both nostrils daily. 06/12/17 02/13/20  Georgiana Shore, PA-C    Allergies Patient has no known allergies.  Family History  Problem Relation Age of Onset  . Hypertension Father   . Other Neg Hx     Social History Social History   Tobacco Use  . Smoking status: Current Every Day Smoker    Packs/day: 0.25    Types: Cigarettes  Substance Use Topics  . Alcohol use: Yes    Comment: occ  . Drug use: No    Review of  Systems  Constitutional: Negative for fever. Eyes: Negative for visual changes. ENT: Positive for sore throat.  Reports left otalgia. Cardiovascular: Negative for chest pain. Respiratory: Negative for shortness of breath. Gastrointestinal: Negative for abdominal pain, vomiting and diarrhea. Genitourinary: Negative for dysuria. Musculoskeletal: Negative for back pain. Skin: Negative for rash. Neurological: Negative for headaches, focal weakness or numbness. ____________________________________________  PHYSICAL EXAM:  VITAL SIGNS: ED Triage Vitals [02/13/20 1240]  Enc Vitals Group     BP 125/71     Pulse 95     Resp      Temp 100.8     Temp src      SpO2 100%     Weight 200 lb (90.7 kg)     Height 5\' 4"  (1.626 m)     Head Circumference      Peak Flow      Pain Score 10     Pain Loc      Pain Edu?      Excl. in GC?     Constitutional: Alert and oriented. Well appearing and in no distress. Head: Normocephalic and atraumatic. Eyes: Conjunctivae are normal. PERRL. Normal extraocular movements Ears: Canals clear. TMs intact bilaterally. Nose: No congestion/rhinorrhea/epistaxis. Mouth/Throat: Mucous membranes are moist.  Lips midline and tonsils are enlarged, and mildly erythematous.  No brawny sublingual edema is noted. Neck: Supple. No thyromegaly. Hematological/Lymphatic/Immunological: Mildly tender cervical lymphadenopathy. Cardiovascular: Normal rate, regular rhythm.  Normal distal pulses. Respiratory: Normal respiratory effort. No wheezes/rales/rhonchi. Musculoskeletal: Nontender with normal range of motion in all extremities.  Neurologic:  Normal gait without ataxia. Normal speech and language. No gross focal neurologic deficits are appreciated. Skin:  Skin is warm, dry and intact. No rash noted. ____________________________________________   LABS (pertinent positives/negatives) Labs Reviewed  GROUP A STREP BY PCR - Abnormal; Notable for the following components:       Result Value   Group A Strep by PCR DETECTED (*)    All other components within normal limits   ____________________________________________  PROCEDURES  Penicillin G benzathine 1.2 million units IM Tylenol 650 mg PO  Procedures ____________________________________________  INITIAL IMPRESSION / ASSESSMENT AND PLAN / ED COURSE  Evaluation of sudden sore throat pain difficulty swallowing.  She denies any frank fevers but she has been taking Aleve on schedule.  She will be treated empirically for strep pharyngitis based on her clinical presentation.  Strep test is pending at time of disposition.  She is opted to treat with a single dose of penicillin benzathine in the ED.  Return precautions have been discussed.  Christine Hudson was evaluated in Emergency Department on 02/13/2020 for the symptoms described in the history of present illness. She was evaluated in the context of the global COVID-19 pandemic, which necessitated consideration that the patient might be at risk for infection with the SARS-CoV-2 virus that causes COVID-19. Institutional protocols and algorithms that pertain to the evaluation of patients at risk for COVID-19 are in a state of rapid change based on information released by regulatory bodies including the CDC and federal and state organizations. These policies and algorithms were followed during the patient's care in the ED. ____________________________________________  FINAL CLINICAL IMPRESSION(S) / ED DIAGNOSES  Final diagnoses:  Strep pharyngitis      Carmie End, Dannielle Karvonen, PA-C 02/13/20 1415    Harvest Dark, MD 02/13/20 [id number] Dannielle Karvonen, PA-C 02/13/20 1445    Harvest Dark, MD 02/13/20 1512

## 2020-02-13 NOTE — ED Notes (Signed)
Awaiting for pharmacy to verify/send medication.

## 2022-10-08 ENCOUNTER — Encounter (HOSPITAL_COMMUNITY): Payer: Self-pay | Admitting: Obstetrics and Gynecology

## 2022-10-08 ENCOUNTER — Inpatient Hospital Stay (HOSPITAL_COMMUNITY)
Admission: AD | Admit: 2022-10-08 | Discharge: 2022-10-08 | Disposition: A | Discharge status: Home / Self Care | Payer: Medicaid Other | Attending: Obstetrics and Gynecology | Admitting: Obstetrics and Gynecology

## 2022-10-08 ENCOUNTER — Other Ambulatory Visit: Payer: Self-pay

## 2022-10-08 DIAGNOSIS — Z3A01 Less than 8 weeks gestation of pregnancy: Secondary | ICD-10-CM

## 2022-10-08 DIAGNOSIS — Z3201 Encounter for pregnancy test, result positive: Secondary | ICD-10-CM | POA: Insufficient documentation

## 2022-10-08 DIAGNOSIS — O219 Vomiting of pregnancy, unspecified: Secondary | ICD-10-CM | POA: Diagnosis not present

## 2022-10-08 DIAGNOSIS — R110 Nausea: Secondary | ICD-10-CM

## 2022-10-08 LAB — POCT PREGNANCY, URINE: Preg Test, Ur: POSITIVE — AB

## 2022-10-08 MED ORDER — METOCLOPRAMIDE HCL 10 MG PO TABS: 10.0000 mg | ORAL_TABLET | Freq: Four times a day (QID) | ORAL | 1 refills | Status: DC

## 2022-10-08 MED ORDER — PROMETHAZINE HCL 25 MG PO TABS: 25.0000 mg | ORAL_TABLET | Freq: Four times a day (QID) | ORAL | 1 refills | Status: DC | PRN

## 2022-10-08 NOTE — Discharge Instructions (Signed)

## 2022-10-08 NOTE — MAU Provider Note (Signed)
Event Date/Time   First Provider Initiated Contact with Patient 10/08/22 1027      S [name] is a 36 y.o. [id number] patient who presents to MAU today with complaint of nausea. She reports she is having all the pregnancy symptoms but has not taken a pregnancy test. Requesting nausea medication for at home   O BP (!) 130/59 (BP Location: Right Arm)   Pulse 75   Temp 98.1 F (36.7 C) (Oral)   Resp 18   Ht 5\' 5"  (1.651 m)   Wt 85.8 kg   LMP 08/16/2022   SpO2 100%   BMI 31.48 kg/m  Physical Exam Vitals and nursing note reviewed.  Constitutional:      General: She is not in acute distress.    Appearance: She is well-developed.  HENT:     Head: Normocephalic.  Eyes:     Pupils: Pupils are equal, round, and reactive to light.  Cardiovascular:     Rate and Rhythm: Normal rate and regular rhythm.     Heart sounds: Normal heart sounds.  Pulmonary:     Effort: Pulmonary effort is normal. No respiratory distress.     Breath sounds: Normal breath sounds.  Abdominal:     General: Bowel sounds are normal. There is no distension.     Palpations: Abdomen is soft.     Tenderness: There is no abdominal tenderness.  Skin:    General: Skin is warm and dry.  Neurological:     Mental Status: She is alert and oriented to person, place, and time.  Psychiatric:        Mood and Affect: Mood normal.        Behavior: Behavior normal.        Thought Content: Thought content normal.        Judgment: Judgment normal.    Results for orders placed or performed during the hospital encounter of 10/08/22 (from the past 24 hour(s))  Pregnancy, urine POC     Status: Abnormal   Collection Time: 10/08/22 10:19 AM  Result Value Ref Range   Preg Test, Ur POSITIVE (A) NEGATIVE     A Medical screening exam complete 1. Positive pregnancy test   2. Nausea   3. [redacted] weeks gestation of pregnancy    P -Discharge home in stable condition -Rx for reglan and phenergan sent to pharmacy -First  trimester precautions discussed -Patient advised to follow-up with OB to establish prenatal care -Patient may return to MAU as needed or if her condition were to change or worsen   10/10/22, Rolm Bookbinder 10/08/2022 10:27 AM

## 2022-10-08 NOTE — MAU Note (Signed)
Christine Hudson is a 36 y.o. at Unknown here in MAU reporting: she wants to confirm pregnancy because she has all the symptoms.  States has tender breast and nausea.  Denies VB.  Denies current pain, has had "light little cramps".  Inquiring about ultrasound. LMP: 08/16/22 Onset of complaint: 2 weeks Pain score: 0 Vitals:   10/08/22 1020  BP: (!) 130/59  Pulse: 75  Resp: 18  Temp: 98.1 F (36.7 C)  SpO2: 100%     FHT:NA Lab orders placed from triage:   UPT

## 2022-12-01 ENCOUNTER — Inpatient Hospital Stay (HOSPITAL_COMMUNITY)
Admission: AD | Admit: 2022-12-01 | Discharge: 2022-12-01 | Disposition: A | Discharge status: Home / Self Care | Payer: Medicaid Other | Attending: Obstetrics and Gynecology | Admitting: Obstetrics and Gynecology

## 2022-12-01 DIAGNOSIS — R0981 Nasal congestion: Secondary | ICD-10-CM | POA: Insufficient documentation

## 2022-12-01 DIAGNOSIS — R059 Cough, unspecified: Secondary | ICD-10-CM | POA: Diagnosis not present

## 2022-12-01 DIAGNOSIS — Z3A15 15 weeks gestation of pregnancy: Secondary | ICD-10-CM | POA: Insufficient documentation

## 2022-12-01 DIAGNOSIS — O26892 Other specified pregnancy related conditions, second trimester: Secondary | ICD-10-CM | POA: Diagnosis not present

## 2022-12-01 NOTE — MAU Note (Signed)
Christine Hudson is a 37 y.o. at 110w2d here in MAU reporting: has a cold, really stuffy nose.  Can't sleep at night, unsure what she can take. Runny nose, no cough, no sore throat or fever.  Denies OB problem.  Onset of complaint: 2 days ago Pain score: no pain Vitals:   12/01/22 1348 12/01/22 1349  BP:  121/61  Pulse:  76  Resp:  18  Temp:  98.4 F (36.9 C)  SpO2: 100% 100%     FHT:164 Lab orders placed from triage:  none   Asking if she can get an [place]

## 2022-12-01 NOTE — MAU Provider Note (Signed)
Event Date/Time   First Provider Initiated Contact with Patient 12/01/22 1402      S [name] is a 37 y.o. [id number] patient who presents to MAU today with complaint of cold and flu like symptoms. Patient denies having a fever and chills. Just a stuffy nose and cough. Denies attempting to relieve symptoms. Patient states "she did not know what she could take"  O BP 121/61 (BP Location: Right Arm)   Pulse 76   Temp 98.4 F (36.9 C) (Oral)   Resp 18   Ht 5\' 5"  (1.651 m)   Wt 84.4 kg   LMP 08/16/2022   SpO2 100%   BMI 30.97 kg/m  Physical Exam Vitals and nursing note reviewed.  Constitutional:      General: She is not in acute distress.    Appearance: Normal appearance. She is ill-appearing.  HENT:     Head: Normocephalic.  Pulmonary:     Effort: Pulmonary effort is normal.  Musculoskeletal:        General: No swelling. Normal range of motion.     Cervical back: Normal range of motion.  Skin:    General: Skin is warm and dry.  Neurological:     Mental Status: She is alert and oriented to person, place, and time.  Psychiatric:        Mood and Affect: Mood normal.   FHT obtained in triage   A Medical screening exam complete  - Patient afebrile and denies SOB.  - Plan for discharge.     P - Safe medications in pregnancy list provided.  - Discharge from MAU in stable condition - Warning signs for worsening condition that would warrant emergency follow-up discussed - Patient may return to MAU as needed   Deloris Ping, [place] 12/01/2022 2:02 PM

## 2022-12-21 ENCOUNTER — Encounter (HOSPITAL_BASED_OUTPATIENT_CLINIC_OR_DEPARTMENT_OTHER): Payer: Medicaid Other | Admitting: Obstetrics & Gynecology

## 2022-12-21 ENCOUNTER — Encounter (HOSPITAL_BASED_OUTPATIENT_CLINIC_OR_DEPARTMENT_OTHER): Payer: Self-pay

## 2023-01-21 ENCOUNTER — Inpatient Hospital Stay (HOSPITAL_COMMUNITY)
Admission: AD | Admit: 2023-01-21 | Discharge: 2023-01-21 | Disposition: A | Discharge status: Home / Self Care | Payer: Medicaid Other | Attending: Obstetrics & Gynecology | Admitting: Obstetrics & Gynecology

## 2023-01-21 ENCOUNTER — Other Ambulatory Visit: Payer: Self-pay

## 2023-01-21 DIAGNOSIS — Z711 Person with feared health complaint in whom no diagnosis is made: Secondary | ICD-10-CM

## 2023-01-21 DIAGNOSIS — O0932 Supervision of pregnancy with insufficient antenatal care, second trimester: Secondary | ICD-10-CM | POA: Diagnosis not present

## 2023-01-21 DIAGNOSIS — Z3A22 22 weeks gestation of pregnancy: Secondary | ICD-10-CM | POA: Diagnosis not present

## 2023-01-21 NOTE — Discharge Instructions (Signed)

## 2023-01-21 NOTE — MAU Note (Signed)
Christine Hudson is a 37 y.o. at 40w4dhere in MAU reporting: she hasn't had any PNC, hasn't been able to go to prenatal appt @ [facility] due to work, desires chech up  States wants an ultrasound to be sure the pregnancy is "fine".  Denies VB and pain.  Reports +FM. LMP: NA Onset of complaint: NA Pain score: 0 Vitals:   01/21/23 1227  BP: (!) 122/59  Pulse: 94  Resp: 18  Temp: 98.2 F (36.8 C)  SpO2: 100%     FHT:135 bpm Lab orders placed from triage:   None

## 2023-01-21 NOTE — MAU Provider Note (Signed)
Event Date/Time   First Provider Initiated Contact with Patient 01/21/23 1241      [name] is a 37 y.o. [id number] patient who presents to MAU today requesting an ultrasound. 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 She states she wants to know what she's having and see the baby.  She denies any pain or bleeding. Reports feeling fetal movement  O BP (!) 122/59 (BP Location: Right Arm)   Pulse 94   Temp 98.2 F (36.8 C) (Oral)   Resp 18   Ht '5\' 6"'$  (1.676 m)   Wt 88.6 kg   LMP 08/16/2022   SpO2 100%   BMI 31.54 kg/m  Physical Exam Vitals and nursing note reviewed.  Constitutional:      General: She is not in acute distress.    Appearance: She is well-developed.  HENT:     Head: Normocephalic.  Eyes:     Pupils: Pupils are equal, round, and reactive to light.  Cardiovascular:     Rate and Rhythm: Normal rate and regular rhythm.     Heart sounds: Normal heart sounds.  Pulmonary:     Effort: Pulmonary effort is normal. No respiratory distress.     Breath sounds: Normal breath sounds.  Abdominal:     General: Bowel sounds are normal. There is no distension.     Palpations: Abdomen is soft.     Tenderness: There is no abdominal tenderness.  Skin:    General: Skin is warm and dry.  Neurological:     Mental Status: She is alert and oriented to person, place, and time.     Motor: No abnormal muscle tone.     Coordination: Coordination normal.     Deep Tendon Reflexes: Reflexes are normal and symmetric. Reflexes normal.  Psychiatric:        Mood and Affect: Mood normal.        Behavior: Behavior normal.        Thought Content: Thought content normal.        Judgment: Judgment normal.    FHT: 135 bpm  A Medical screening exam complete 1. Physically well but worried   2. [redacted] weeks gestation of pregnancy    -Discussed MAU does not do routine ultrasounds and can order outpatient as well as facilitate outpatient follow up. Patient  agreeable to plan of care  P -Discharge home in stable condition -Second trimester precautions discussed -Patient advised to follow-up with OB to establish care -Patient may return to MAU as needed or if her condition were to change or worsen    Wende Mott, [place] 01/21/2023 12:41 PM

## 2023-02-10 ENCOUNTER — Inpatient Hospital Stay (HOSPITAL_BASED_OUTPATIENT_CLINIC_OR_DEPARTMENT_OTHER): Payer: Medicaid Other

## 2023-02-10 ENCOUNTER — Inpatient Hospital Stay (HOSPITAL_COMMUNITY)
Admission: AD | Admit: 2023-02-10 | Discharge: 2023-02-10 | Disposition: A | Discharge status: Home / Self Care | Payer: Medicaid Other | Attending: Obstetrics & Gynecology | Admitting: Obstetrics & Gynecology

## 2023-02-10 ENCOUNTER — Encounter (HOSPITAL_COMMUNITY): Payer: Self-pay | Admitting: Obstetrics & Gynecology

## 2023-02-10 DIAGNOSIS — W500XXA Accidental hit or strike by another person, initial encounter: Secondary | ICD-10-CM

## 2023-02-10 DIAGNOSIS — T1490XA Injury, unspecified, initial encounter: Secondary | ICD-10-CM | POA: Insufficient documentation

## 2023-02-10 DIAGNOSIS — X58XXXA Exposure to other specified factors, initial encounter: Secondary | ICD-10-CM | POA: Insufficient documentation

## 2023-02-10 DIAGNOSIS — Z3A25 25 weeks gestation of pregnancy: Secondary | ICD-10-CM

## 2023-02-10 DIAGNOSIS — Y929 Unspecified place or not applicable: Secondary | ICD-10-CM | POA: Insufficient documentation

## 2023-02-10 DIAGNOSIS — O9A212 Injury, poisoning and certain other consequences of external causes complicating pregnancy, second trimester: Secondary | ICD-10-CM | POA: Diagnosis not present

## 2023-02-10 DIAGNOSIS — O368130 Decreased fetal movements, third trimester, not applicable or unspecified: Secondary | ICD-10-CM | POA: Diagnosis present

## 2023-02-10 DIAGNOSIS — O368120 Decreased fetal movements, second trimester, not applicable or unspecified: Secondary | ICD-10-CM | POA: Insufficient documentation

## 2023-02-10 DIAGNOSIS — S3991XA Unspecified injury of abdomen, initial encounter: Secondary | ICD-10-CM | POA: Diagnosis not present

## 2023-02-10 DIAGNOSIS — Y939 Activity, unspecified: Secondary | ICD-10-CM | POA: Diagnosis not present

## 2023-02-10 HISTORY — DX: Other specified health status: Z78.9
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
## 2023-02-14 ENCOUNTER — Other Ambulatory Visit: Payer: Self-pay

## 2023-02-14 ENCOUNTER — Encounter: Payer: Self-pay | Admitting: Obstetrics and Gynecology

## 2023-02-14 ENCOUNTER — Other Ambulatory Visit (HOSPITAL_COMMUNITY)
Admission: RE | Admit: 2023-02-14 | Discharge: 2023-02-14 | Disposition: A | Discharge status: Home / Self Care | Payer: Medicaid Other | Source: Ambulatory Visit | Attending: Obstetrics and Gynecology | Admitting: Obstetrics and Gynecology

## 2023-02-14 ENCOUNTER — Ambulatory Visit (INDEPENDENT_AMBULATORY_CARE_PROVIDER_SITE_OTHER): Payer: Medicaid Other | Admitting: Obstetrics and Gynecology

## 2023-02-14 VITALS — BP 120/74 | HR 79 | Wt 194.6 lb

## 2023-02-14 DIAGNOSIS — O09522 Supervision of elderly multigravida, second trimester: Secondary | ICD-10-CM | POA: Diagnosis not present

## 2023-02-14 DIAGNOSIS — Z3480 Encounter for supervision of other normal pregnancy, unspecified trimester: Secondary | ICD-10-CM | POA: Diagnosis not present

## 2023-02-14 DIAGNOSIS — Z3A26 26 weeks gestation of pregnancy: Secondary | ICD-10-CM | POA: Diagnosis not present

## 2023-02-14 NOTE — Progress Notes (Signed)
Subjective:  Christine Hudson is a 37 y.o. [id number] at [redacted]w[redacted]d being seen today for her first OB visit. EDD by LMP and confirmed by U/S. H/O TSVD x 2 without problems. Denies any medical problems or chronic medications.ongoing prenatal care.  She is currently monitored for the following issues for this low-risk pregnancy and has Supervision of other normal pregnancy, antepartum on their problem list.  Patient reports no complaints.  Contractions: Irritability. Vag. Bleeding: None.  Movement: Present. Denies leaking of fluid.   The following portions of the patient's history were reviewed and updated as appropriate: allergies, current medications, past family history, past medical history, past social history, past surgical history and problem list. Problem list updated.  Objective:   Vitals:   02/14/23 1420  BP: 120/74  Pulse: 79  Weight: 194 lb 9.6 oz (88.3 kg)    Fetal Status: Fetal Heart Rate (bpm): 143   Movement: Present     General:  Alert, oriented and cooperative. Patient is in no acute distress.  Skin: Skin is warm and dry. No rash noted.   Cardiovascular: Normal heart rate noted  Respiratory: Normal respiratory effort, no problems with respiration noted  Abdomen: Soft, gravid, appropriate for gestational age. Pain/Pressure: Present     Pelvic:  Cervical exam performed        Extremities: Normal range of motion.  Edema: Trace  Mental Status: Normal mood and affect. Normal behavior. Normal judgment and thought content.   Urinalysis:      Assessment and Plan:  Pregnancy: [id number] at [redacted]w[redacted]d  1. Supervision of other normal pregnancy, antepartum Prenatal care and labs reviewed with pt Genetic testing discussed F/U U/S in 4-6 weeks from last scan. Glucola next visit - Culture, OB Urine - CBC/D/Plt+RPR+Rh+ABO+RubIgG... - [place] MFM OB DETAIL +14 WK; Future - Hemoglobin A1c - Panorama Prenatal Test Full Panel - [facility] Basic Panel - Cytology - PAP( [place])  Preterm labor  symptoms and general obstetric precautions including but not limited to vaginal bleeding, contractions, leaking of fluid and fetal movement were reviewed in detail with the patient. Please refer to After Visit Summary for other counseling recommendations.  Return in about 2 weeks (around 02/28/2023) for OB visit, face to face, any provider, fasting Glucola.   Chancy Milroy, MD

## 2023-02-14 NOTE — Addendum Note (Signed)
Addended by: Bethanne Ginger on: 02/14/2023 05:06 PM   Modules accepted: Orders

## 2023-02-15 LAB — CBC/D/PLT+RPR+RH+ABO+RUBIGG...
Antibody Screen: NEGATIVE
Basophils Absolute: 0 10*3/uL (ref 0.0–0.2)
Basos: 0 %
EOS (ABSOLUTE): 0 10*3/uL (ref 0.0–0.4)
Eos: 1 %
HCV Ab: NONREACTIVE
HIV Screen 4th Generation wRfx: NONREACTIVE
Hematocrit: 33 % — ABNORMAL LOW (ref 34.0–46.6)
Hemoglobin: 10.4 g/dL — ABNORMAL LOW (ref 11.1–15.9)
Hepatitis B Surface Ag: NEGATIVE
Immature Grans (Abs): 0 10*3/uL (ref 0.0–0.1)
Immature Granulocytes: 0 %
Lymphocytes Absolute: 2.1 10*3/uL (ref 0.7–3.1)
Lymphs: 25 %
MCH: 22.7 pg — ABNORMAL LOW (ref 26.6–33.0)
MCHC: 31.5 g/dL (ref 31.5–35.7)
MCV: 72 fL — ABNORMAL LOW (ref 79–97)
Monocytes Absolute: 0.6 10*3/uL (ref 0.1–0.9)
Monocytes: 7 %
Neutrophils Absolute: 5.7 10*3/uL (ref 1.4–7.0)
Neutrophils: 67 %
Platelets: 270 10*3/uL (ref 150–450)
RBC: 4.59 x10E6/uL (ref 3.77–5.28)
RDW: 14.8 % (ref 11.7–15.4)
RPR Ser Ql: NONREACTIVE
Rh Factor: POSITIVE
Rubella Antibodies, IGG: 1.38 index (ref >0.99)
WBC: 8.5 10*3/uL (ref 3.4–10.8)

## 2023-02-15 LAB — HEMOGLOBIN A1C
Est. average glucose Bld gHb Est-mCnc: 108 mg/dL
Hgb A1c MFr Bld: 5.4 % (ref 4.8–5.6)

## 2023-02-15 LAB — HCV INTERPRETATION

## 2023-02-16 LAB — CYTOLOGY - PAP
Chlamydia: NEGATIVE
Comment: NEGATIVE
Comment: NEGATIVE
Comment: NORMAL
Diagnosis: NEGATIVE
High risk HPV: NEGATIVE
Neisseria Gonorrhea: NEGATIVE

## 2023-02-16 LAB — CULTURE, OB URINE

## 2023-02-16 LAB — URINE CULTURE, OB REFLEX

## 2023-02-17 ENCOUNTER — Ambulatory Visit: Payer: Medicaid Other | Admitting: *Deleted

## 2023-02-17 ENCOUNTER — Ambulatory Visit: Payer: Medicaid Other | Attending: Obstetrics and Gynecology

## 2023-02-17 VITALS — BP 125/59 | HR 81

## 2023-02-17 DIAGNOSIS — O09522 Supervision of elderly multigravida, second trimester: Secondary | ICD-10-CM | POA: Diagnosis not present

## 2023-02-17 DIAGNOSIS — O99212 Obesity complicating pregnancy, second trimester: Secondary | ICD-10-CM | POA: Diagnosis not present

## 2023-02-17 DIAGNOSIS — Z363 Encounter for antenatal screening for malformations: Secondary | ICD-10-CM | POA: Insufficient documentation

## 2023-02-17 DIAGNOSIS — Z3480 Encounter for supervision of other normal pregnancy, unspecified trimester: Secondary | ICD-10-CM

## 2023-02-17 DIAGNOSIS — Z3A26 26 weeks gestation of pregnancy: Secondary | ICD-10-CM | POA: Insufficient documentation

## 2023-02-21 ENCOUNTER — Other Ambulatory Visit: Payer: Self-pay | Admitting: *Deleted

## 2023-02-21 DIAGNOSIS — O99212 Obesity complicating pregnancy, second trimester: Secondary | ICD-10-CM

## 2023-02-21 DIAGNOSIS — O09522 Supervision of elderly multigravida, second trimester: Secondary | ICD-10-CM

## 2023-02-22 LAB — PANORAMA PRENATAL TEST FULL PANEL:PANORAMA TEST PLUS 5 ADDITIONAL MICRODELETIONS: FETAL FRACTION: 12.3

## 2023-02-25 LAB — HORIZON CUSTOM: REPORT SUMMARY: POSITIVE — AB

## 2023-02-28 ENCOUNTER — Other Ambulatory Visit: Payer: Self-pay

## 2023-02-28 DIAGNOSIS — Z3480 Encounter for supervision of other normal pregnancy, unspecified trimester: Secondary | ICD-10-CM

## 2023-03-01 ENCOUNTER — Encounter: Payer: Medicaid Other | Admitting: Advanced Practice Midwife

## 2023-03-01 ENCOUNTER — Telehealth: Payer: Self-pay | Admitting: General Practice

## 2023-03-01 ENCOUNTER — Other Ambulatory Visit: Payer: Medicaid Other

## 2023-03-01 NOTE — Telephone Encounter (Signed)
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 Please offer partner testing kit and refer for genetic testing if desires. Thanks Casimiro Needle

## 2023-03-01 NOTE — Telephone Encounter (Signed)
Called patient and discussed Horizon test results and partner testing. Patient is aware kit is available for pick up in the office. Reviewed upcoming appts with patient. Patient verbalized understanding.

## 2023-03-16 ENCOUNTER — Other Ambulatory Visit: Payer: Self-pay | Admitting: *Deleted

## 2023-03-16 ENCOUNTER — Ambulatory Visit: Payer: Medicaid Other | Attending: Maternal & Fetal Medicine

## 2023-03-16 ENCOUNTER — Ambulatory Visit: Payer: Medicaid Other | Admitting: *Deleted

## 2023-03-16 VITALS — BP 123/57 | HR 85

## 2023-03-16 DIAGNOSIS — Z3A30 30 weeks gestation of pregnancy: Secondary | ICD-10-CM | POA: Diagnosis not present

## 2023-03-16 DIAGNOSIS — O09523 Supervision of elderly multigravida, third trimester: Secondary | ICD-10-CM | POA: Insufficient documentation

## 2023-03-16 DIAGNOSIS — O99213 Obesity complicating pregnancy, third trimester: Secondary | ICD-10-CM

## 2023-03-16 DIAGNOSIS — O99212 Obesity complicating pregnancy, second trimester: Secondary | ICD-10-CM | POA: Diagnosis not present

## 2023-03-16 DIAGNOSIS — O09522 Supervision of elderly multigravida, second trimester: Secondary | ICD-10-CM | POA: Insufficient documentation

## 2023-03-16 DIAGNOSIS — O285 Abnormal chromosomal and genetic finding on antenatal screening of mother: Secondary | ICD-10-CM | POA: Diagnosis not present

## 2023-03-16 DIAGNOSIS — E669 Obesity, unspecified: Secondary | ICD-10-CM

## 2023-03-16 DIAGNOSIS — D563 Thalassemia minor: Secondary | ICD-10-CM

## 2023-03-30 ENCOUNTER — Encounter: Payer: Self-pay | Admitting: Obstetrics and Gynecology

## 2023-03-31 ENCOUNTER — Other Ambulatory Visit: Payer: Self-pay

## 2023-03-31 ENCOUNTER — Ambulatory Visit (INDEPENDENT_AMBULATORY_CARE_PROVIDER_SITE_OTHER): Payer: Self-pay | Admitting: Obstetrics and Gynecology

## 2023-03-31 VITALS — BP 123/77 | HR 93 | Wt 200.5 lb

## 2023-03-31 DIAGNOSIS — O09522 Supervision of elderly multigravida, second trimester: Secondary | ICD-10-CM

## 2023-03-31 DIAGNOSIS — Z3493 Encounter for supervision of normal pregnancy, unspecified, third trimester: Secondary | ICD-10-CM

## 2023-03-31 NOTE — Progress Notes (Addendum)
   PRENATAL VISIT NOTE  Subjective:  Christine Hudson is a 37 y.o. [id number] at [redacted]w[redacted]d being seen today for ongoing prenatal care.  She is currently monitored for the following issues for this low-risk pregnancy and has Supervision of other normal pregnancy, antepartum and AMA (advanced maternal age) multigravida 35+, second trimester on their problem list.  Patient reports no complaints.  Contractions: Irritability. Vag. Bleeding: None.  Movement: Present. Denies leaking of fluid.   The following portions of the patient's history were reviewed and updated as appropriate: allergies, current medications, past family history, past medical history, past social history, past surgical history and problem list.   Objective:   Vitals:   03/31/23 1026  BP: 123/77  Pulse: 93  Weight: 200 lb 8 oz (90.9 kg)    Fetal Status: Fetal Heart Rate (bpm): 150   Movement: Present     General:  Alert, oriented and cooperative. Patient is in no acute distress.  Skin: Skin is warm and dry. No rash noted.   Cardiovascular: Normal heart rate noted  Respiratory: Normal respiratory effort, no problems with respiration noted  Abdomen: Soft, gravid, appropriate for gestational age.  Pain/Pressure: Present     Pelvic: Cervical exam deferred        Extremities: Normal range of motion.  Edema: None  Mental Status: Normal mood and affect. Normal behavior. Normal judgment and thought content.   Assessment and Plan:  Pregnancy: [id number] at [redacted]w[redacted]d  1. Supervision of low-risk pregnancy, third trimester BP and FHR normal  Feeling vigorous movement  Expressed interest in water birth, discussed water birth class (one available June 6th), natural childbirth class Send certificate through [employer]  Must meet with water birth provider, will schedule next visit   2. AMA (advanced maternal age) multigravida 35+, second trimester 4/25 u/s WNL Follow up growth 5/24   Preterm labor symptoms and general obstetric precautions  including but not limited to vaginal bleeding, contractions, leaking of fluid and fetal movement were reviewed in detail with the patient. Please refer to After Visit Summary for other counseling recommendations.    Future Appointments  Date Time Provider Department Center  04/14/2023  1:15 PM Donna Bernard [facility]  04/15/2023  8:30 AM [facility] NURSE [facility]  04/15/2023  8:45 AM [facility] US5 [facility]  05/12/2023 11:15 AM [facility] NURSE [facility]  05/12/2023 11:30 AM [facility] US3 [facility]    Albertine Grates, FNP

## 2023-03-31 NOTE — Patient Instructions (Signed)
[email or web address]   Considering Waterbirth? Guide for patients at Center for [facility]) Why consider waterbirth? Gentle birth for babies  Less pain medicine used in labor  May allow for passive descent/less pushing  May reduce perineal tears  More mobility and instinctive maternal position changes  Increased maternal relaxation   Is waterbirth safe? What are the risks of infection, drowning or other complications? Infection:  Very low risk (3.7 % for tub vs 4.8% for bed)  7 in 8000 waterbirths with documented infection  Poorly cleaned equipment most common cause  Slightly lower group B strep transmission rate  Drowning  Maternal:  Very low risk  Related to seizures or fainting  Newborn:  Very low risk. No evidence of increased risk of respiratory problems in multiple large studies  Physiological protection from breathing under water  Avoid underwater birth if there are any fetal complications  Once baby's head is out of the water, keep it out.  Birth complication  Some reports of cord trauma, but risk decreased by bringing baby to surface gradually  No evidence of increased risk of shoulder dystocia. Mothers can usually change positions faster in water than in a bed, possibly aiding the maneuvers to free the shoulder.   There are 2 things you MUST do to have a waterbirth with [facility]: Attend a waterbirth class at [facility] at [facility]   3rd Wednesday of every month from 7-9 pm (virtual during COVID) [employer] at [email or web address] or [email or web address] or by calling [phone] Bring [place] the certificate from the class to your prenatal appointment or send via MyChart Meet with a midwife at 36 weeks* to see if you can still plan a waterbirth and to sign the consent.   *We also recommend that you schedule as many of your prenatal visits with a midwife as possible.    Helpful information: You may want to bring a bathing  suit top to the hospital to wear during labor but this is optional.  All other supplies are provided by the hospital. Please arrive at the hospital with signs of active labor, and do not wait at home until late in labor. It takes 45 min- 1 hour for fetal monitoring, and check in to your room to take place, plus transport and filling of the waterbirth tub.    Things that would prevent you from having a waterbirth: Premature, <37wks  Previous cesarean birth  Presence of thick meconium-stained fluid  Multiple gestation (Twins, triplets, etc.)  Uncontrolled diabetes or gestational diabetes requiring medication  Hypertension diagnosed in pregnancy or preexisting hypertension (gestational hypertension, preeclampsia, or chronic hypertension) Fetal growth restriction (your baby measures less than 10th percentile on ultrasound) Heavy vaginal bleeding  Non-reassuring fetal heart rate  Active infection (MRSA, etc.). Group B Strep is NOT a contraindication for waterbirth.  If your labor has to be induced and induction method requires continuous monitoring of the baby's heart rate  Other risks/issues identified by your obstetrical provider   Please remember that birth is unpredictable. Under certain unforeseeable circumstances your provider may advise against giving birth in the tub. These decisions will be made on a case-by-case basis and with the safety of you and your baby as our highest priority.    Updated 02/24/22

## 2023-04-12 NOTE — Progress Notes (Unsigned)
   PRENATAL VISIT NOTE  Subjective:  Christine Hudson is a 37 y.o. [name] at [redacted]w[redacted]d being seen today for ongoing prenatal care.  She is currently monitored for the following issues for this {Blank single:19197::"high-risk","low-risk"} pregnancy and has Supervision of other normal pregnancy, antepartum and AMA (advanced maternal age) multigravida 35+, second trimester on their problem list.  Patient reports {sx:[id number]}.   .  .   . Denies leaking of fluid.   The following portions of the patient's history were reviewed and updated as appropriate: allergies, current medications, past family history, past medical history, past social history, past surgical history and problem list.   Objective:  There were no vitals filed for this visit.  Fetal Status:           General:  Alert, oriented and cooperative. Patient is in no acute distress.  Skin: Skin is warm and dry. No rash noted.   Cardiovascular: Normal heart rate noted  Respiratory: Normal respiratory effort, no problems with respiration noted  Abdomen: Soft, gravid, appropriate for gestational age.        Pelvic: {Blank single:19197::"Cervical exam performed in the presence of a chaperone","Cervical exam deferred"}        Extremities: Normal range of motion.     Mental Status: Normal mood and affect. Normal behavior. Normal judgment and thought content.   Assessment and Plan:  Pregnancy: [id number] at [redacted]w[redacted]d 1. Supervision of low-risk pregnancy, third trimester ***  2. AMA (advanced maternal age) multigravida 35+, second trimester ***  3. [redacted] weeks gestation of pregnancy ***  {Blank single:19197::"Term","Preterm"} labor symptoms and general obstetric precautions including but not limited to vaginal bleeding, contractions, leaking of fluid and fetal movement were reviewed in detail with the patient. Please refer to After Visit Summary for other counseling recommendations.   No follow-ups on file.  Future Appointments  Date Time Provider  Department Center  04/14/2023  1:15 PM Donna Bernard [facility]  05/13/2023 11:15 AM [facility] NURSE [facility]  05/13/2023 11:30 AM [facility] US2 [facility]    Simone [name], DO

## 2023-04-14 ENCOUNTER — Ambulatory Visit (INDEPENDENT_AMBULATORY_CARE_PROVIDER_SITE_OTHER): Payer: Medicaid Other | Admitting: Obstetrics and Gynecology

## 2023-04-14 VITALS — BP 117/45 | HR 94 | Wt 202.0 lb

## 2023-04-14 DIAGNOSIS — O09522 Supervision of elderly multigravida, second trimester: Secondary | ICD-10-CM

## 2023-04-14 DIAGNOSIS — O09523 Supervision of elderly multigravida, third trimester: Secondary | ICD-10-CM

## 2023-04-14 DIAGNOSIS — Z3A34 34 weeks gestation of pregnancy: Secondary | ICD-10-CM

## 2023-04-14 DIAGNOSIS — Z3493 Encounter for supervision of normal pregnancy, unspecified, third trimester: Secondary | ICD-10-CM

## 2023-04-15 ENCOUNTER — Ambulatory Visit: Payer: Medicaid Other

## 2023-04-24 ENCOUNTER — Inpatient Hospital Stay (HOSPITAL_COMMUNITY)
Admission: AD | Admit: 2023-04-24 | Discharge: 2023-04-24 | Disposition: A | Discharge status: Home / Self Care | Payer: Medicaid Other | Attending: Family Medicine | Admitting: Family Medicine

## 2023-04-24 ENCOUNTER — Encounter (HOSPITAL_COMMUNITY): Payer: Self-pay | Admitting: Family Medicine

## 2023-04-24 DIAGNOSIS — O09523 Supervision of elderly multigravida, third trimester: Secondary | ICD-10-CM | POA: Diagnosis not present

## 2023-04-24 DIAGNOSIS — R030 Elevated blood-pressure reading, without diagnosis of hypertension: Secondary | ICD-10-CM | POA: Diagnosis not present

## 2023-04-24 DIAGNOSIS — O163 Unspecified maternal hypertension, third trimester: Secondary | ICD-10-CM | POA: Diagnosis not present

## 2023-04-24 DIAGNOSIS — Z3A36 36 weeks gestation of pregnancy: Secondary | ICD-10-CM | POA: Insufficient documentation

## 2023-04-24 DIAGNOSIS — R519 Headache, unspecified: Secondary | ICD-10-CM | POA: Insufficient documentation

## 2023-04-24 DIAGNOSIS — O26893 Other specified pregnancy related conditions, third trimester: Secondary | ICD-10-CM | POA: Insufficient documentation

## 2023-04-24 DIAGNOSIS — Z3A35 35 weeks gestation of pregnancy: Secondary | ICD-10-CM | POA: Diagnosis not present

## 2023-04-24 LAB — PROTEIN / CREATININE RATIO, URINE
Creatinine, Urine: 132 mg/dL
Protein Creatinine Ratio: 0.16 mg/mg{Cre} — ABNORMAL HIGH (ref 0.00–0.15)
Total Protein, Urine: 21 mg/dL

## 2023-04-24 LAB — COMPREHENSIVE METABOLIC PANEL
ALT: 14 U/L (ref 0–44)
AST: 16 U/L (ref 15–41)
Albumin: 2.7 g/dL — ABNORMAL LOW (ref 3.5–5.0)
Alkaline Phosphatase: 116 U/L (ref 38–126)
Anion gap: 13 (ref 5–15)
BUN: 6 mg/dL (ref 6–20)
CO2: 21 mmol/L — ABNORMAL LOW (ref 22–32)
Calcium: 9.6 mg/dL (ref 8.9–10.3)
Chloride: 102 mmol/L (ref 98–111)
Creatinine, Ser: 0.65 mg/dL (ref 0.44–1.00)
GFR, Estimated: >60 mL/min (ref >60)
Glucose, Bld: 95 mg/dL (ref 70–99)
Potassium: 3.5 mmol/L (ref 3.5–5.1)
Sodium: 136 mmol/L (ref 135–145)
Total Bilirubin: 0.3 mg/dL (ref 0.3–1.2)
Total Protein: 6.1 g/dL — ABNORMAL LOW (ref 6.5–8.1)

## 2023-04-24 LAB — URINALYSIS, ROUTINE W REFLEX MICROSCOPIC
Bilirubin Urine: NEGATIVE
Glucose, UA: NEGATIVE mg/dL
Hgb urine dipstick: NEGATIVE
Ketones, ur: NEGATIVE mg/dL
Nitrite: NEGATIVE
Protein, ur: NEGATIVE mg/dL
Specific Gravity, Urine: 1.014 (ref 1.005–1.030)
pH: 6 (ref 5.0–8.0)

## 2023-04-24 LAB — CBC
HCT: 30.4 % — ABNORMAL LOW (ref 36.0–46.0)
Hemoglobin: 9.4 g/dL — ABNORMAL LOW (ref 12.0–15.0)
MCH: 22.1 pg — ABNORMAL LOW (ref 26.0–34.0)
MCHC: 30.9 g/dL (ref 30.0–36.0)
MCV: 71.4 fL — ABNORMAL LOW (ref 80.0–100.0)
Platelets: 267 10*3/uL (ref 150–400)
RBC: 4.26 MIL/uL (ref 3.87–5.11)
RDW: 14.6 % (ref 11.5–15.5)
WBC: 8.6 10*3/uL (ref 4.0–10.5)
nRBC: 0 % (ref 0.0–0.2)

## 2023-04-24 MED ORDER — ACETAMINOPHEN 500 MG PO TABS
1000.0000 mg | ORAL_TABLET | Freq: Once | ORAL | Status: AC
  Administered 2023-04-24: 1000 mg via ORAL
  Filled 2023-04-24: qty 2

## 2023-04-24 NOTE — MAU Provider Note (Signed)
  History     CSN: [id number]  Arrival date and time: 04/24/23 2100   None     Chief Complaint  Patient presents with   Headache   Christine Hudson is a 37 y.o. [id number] at [redacted]w[redacted]d who presents with 2 days of headache. She took tylenol yesterday which resolved it. She did not take tylenol this evening. She denies VC, swelling and RUQ pain.   {GYN/OB [id number]  Past Medical History:  Diagnosis Date   Medical history non-contributory    No pertinent past medical history     Past Surgical History:  Procedure Laterality Date   DILATION AND CURETTAGE OF UTERUS     INDUCED ABORTION      Family History  Problem Relation Age of Onset   Asthma Mother    Diabetes Mother    Cancer Mother    Hypertension Father    Cancer Maternal Grandmother    Other Neg Hx    Heart disease Neg Hx     Social History   Tobacco Use   Smoking status: Former    Packs/day: .25    Types: Cigarettes    Quit date: 01/21/2023    Years since quitting: 0.2  Vaping Use   Vaping Use: Never used  Substance Use Topics   Alcohol use: Not Currently    Comment: occ   Drug use: Never    Allergies: No Known Allergies  Medications Prior to Admission  Medication Sig Dispense Refill Last Dose   acetaminophen (TYLENOL) 500 MG tablet Take 1,000 mg by mouth every 6 (six) hours as needed (for pain.).   04/23/2023   Prenatal Vit-Fe Fumarate-FA (PRENATAL MULTIVITAMIN) TABS tablet Take 1 tablet by mouth daily at 12 noon.   04/23/2023    Review of Systems Physical Exam   Blood pressure 136/77, pulse 96, temperature 98.9 F (37.2 C), temperature source Oral, last menstrual period 08/16/2022, SpO2 99 %, unknown if currently breastfeeding.  Physical Exam  MAU Course  Procedures  MDM ***  Assessment and Plan  ***  Christine Hudson 04/24/2023, 10:01 PM

## 2023-04-24 NOTE — MAU Note (Signed)
.  Christine Hudson is a 37 y.o. at [redacted]w[redacted]d here in MAU reporting: headache for past 3-5 days, patient reports taking tylenol yesterday with relief but has not taken tylenol today, patient reports not wanting to take medication everyday for relief; patient reports drinking water and eating food with no relief. Headache located on left anterior side of head.   Denies VB, LOF, and reports + FM.  Onset of complaint: Approximately Tuesday or Wednesday of last week  Pain score: 4/10 Vitals:   04/24/23 2118 04/24/23 2120  BP:  (!) 146/75  Pulse:  90  Temp: 98.9 F (37.2 C)   SpO2: 100% 100%     FHT:150 Lab orders placed from triage:  n/a

## 2023-04-26 ENCOUNTER — Encounter: Payer: Medicaid Other | Admitting: Advanced Practice Midwife

## 2023-04-27 ENCOUNTER — Other Ambulatory Visit (HOSPITAL_COMMUNITY)
Admission: RE | Admit: 2023-04-27 | Discharge: 2023-04-27 | Disposition: A | Discharge status: Home / Self Care | Payer: Medicaid Other | Source: Ambulatory Visit | Attending: Certified Nurse Midwife | Admitting: Certified Nurse Midwife

## 2023-04-27 ENCOUNTER — Ambulatory Visit (INDEPENDENT_AMBULATORY_CARE_PROVIDER_SITE_OTHER): Payer: Medicaid Other | Admitting: Certified Nurse Midwife

## 2023-04-27 ENCOUNTER — Other Ambulatory Visit: Payer: Self-pay

## 2023-04-27 VITALS — BP 125/63 | HR 84 | Wt 205.0 lb

## 2023-04-27 DIAGNOSIS — O09523 Supervision of elderly multigravida, third trimester: Secondary | ICD-10-CM

## 2023-04-27 DIAGNOSIS — Z3A36 36 weeks gestation of pregnancy: Secondary | ICD-10-CM | POA: Diagnosis not present

## 2023-04-27 DIAGNOSIS — Z3493 Encounter for supervision of normal pregnancy, unspecified, third trimester: Secondary | ICD-10-CM

## 2023-04-27 DIAGNOSIS — Z3480 Encounter for supervision of other normal pregnancy, unspecified trimester: Secondary | ICD-10-CM | POA: Insufficient documentation

## 2023-04-27 DIAGNOSIS — O09522 Supervision of elderly multigravida, second trimester: Secondary | ICD-10-CM

## 2023-04-27 DIAGNOSIS — O163 Unspecified maternal hypertension, third trimester: Secondary | ICD-10-CM | POA: Diagnosis not present

## 2023-04-27 DIAGNOSIS — O0993 Supervision of high risk pregnancy, unspecified, third trimester: Secondary | ICD-10-CM

## 2023-04-27 NOTE — Progress Notes (Signed)
   PRENATAL VISIT NOTE  Subjective:  Christine Hudson is a 37 y.o. [name] at [redacted]w[redacted]d being seen today for ongoing prenatal care.  She is currently monitored for the following issues for this high-risk pregnancy and has Supervision of other normal pregnancy, antepartum and AMA (advanced maternal age) multigravida 35+, second trimester on their problem list.  Patient reports no complaints.  Contractions: Irritability. Vag. Bleeding: None.  Movement: Present. Denies leaking of fluid.   The following portions of the patient's history were reviewed and updated as appropriate: allergies, current medications, past family history, past medical history, past social history, past surgical history and problem list.   Objective:   Vitals:   04/27/23 1355  BP: 125/63  Pulse: 84  Weight: 205 lb (93 kg)    Fetal Status: Fetal Heart Rate (bpm): 154 Fundal Height: 36 cm Movement: Present     General:  Alert, oriented and cooperative. Patient is in no acute distress.  Skin: Skin is warm and dry. No rash noted.   Cardiovascular: Normal heart rate noted  Respiratory: Normal respiratory effort, no problems with respiration noted  Abdomen: Soft, gravid, appropriate for gestational age.  Pain/Pressure: Absent     Pelvic: Cervical exam deferred        Extremities: Normal range of motion.  Edema: None  Mental Status: Normal mood and affect. Normal behavior. Normal judgment and thought content.   Assessment and Plan:  Pregnancy: [id number] at [redacted]w[redacted]d 1. Encounter for supervision of low-risk pregnancy in third trimester - Doing well, feeling regular and vigorous fetal movement  - Pt is interested in waterbirth.  No contraindications at this time per chart review/patient assessment.   - Pt has a class tomorrow evening, will see CNMs for remainder of office visits  - Discussed waterbirth as option for low-risk pregnancy.  Reviewed conditions that may arise during pregnancy that will risk pt out of waterbirth including  hypertension, diabetes, fetal growth restriction <10%ile, etc.  2. [redacted] weeks gestation of pregnancy - Routine OB care  - Culture, beta strep (group b only) - Cervicovaginal ancillary only( [place])  3. AMA (advanced maternal age) multigravida 35+, second trimester  4. Elevated blood pressure in third trimester of pregnancy - One elevated BP in MAU, repeat were normal and all pressures in pregnancy have been normotensive with low diastolic. Pt aware hypertension risks her out of waterbirth.  Preterm labor symptoms and general obstetric precautions including but not limited to vaginal bleeding, contractions, leaking of fluid and fetal movement were reviewed in detail with the patient. Please refer to After Visit Summary for other counseling recommendations.   Return in about 1 week (around 05/04/2023) for LOB, IN-PERSON.  Future Appointments  Date Time Provider Department Center  05/06/2023 10:55 AM Sissy Hoff [facility]  05/13/2023 11:15 AM [facility] NURSE [facility]  05/13/2023 11:30 AM [facility] US2 [facility]   Bernerd Limbo, CNM

## 2023-04-28 LAB — CERVICOVAGINAL ANCILLARY ONLY
Chlamydia: NEGATIVE
Comment: NEGATIVE
Comment: NORMAL
Neisseria Gonorrhea: NEGATIVE

## 2023-05-01 LAB — CULTURE, BETA STREP (GROUP B ONLY): Strep Gp B Culture: NEGATIVE

## 2023-05-04 DIAGNOSIS — Z3483 Encounter for supervision of other normal pregnancy, third trimester: Secondary | ICD-10-CM | POA: Diagnosis not present

## 2023-05-04 DIAGNOSIS — Z3482 Encounter for supervision of other normal pregnancy, second trimester: Secondary | ICD-10-CM | POA: Diagnosis not present

## 2023-05-06 ENCOUNTER — Encounter: Payer: Medicaid Other | Admitting: Certified Nurse Midwife

## 2023-05-06 DIAGNOSIS — O0993 Supervision of high risk pregnancy, unspecified, third trimester: Secondary | ICD-10-CM

## 2023-05-06 DIAGNOSIS — O09523 Supervision of elderly multigravida, third trimester: Secondary | ICD-10-CM

## 2023-05-06 DIAGNOSIS — Z3A37 37 weeks gestation of pregnancy: Secondary | ICD-10-CM

## 2023-05-12 ENCOUNTER — Ambulatory Visit: Payer: Medicaid Other

## 2023-05-12 ENCOUNTER — Encounter: Payer: Medicaid Other | Admitting: Obstetrics and Gynecology

## 2023-05-13 ENCOUNTER — Ambulatory Visit (HOSPITAL_BASED_OUTPATIENT_CLINIC_OR_DEPARTMENT_OTHER): Payer: Medicaid Other

## 2023-05-13 ENCOUNTER — Ambulatory Visit: Payer: Medicaid Other | Attending: Maternal & Fetal Medicine | Admitting: *Deleted

## 2023-05-13 VITALS — BP 129/60 | HR 96

## 2023-05-13 DIAGNOSIS — O09523 Supervision of elderly multigravida, third trimester: Secondary | ICD-10-CM | POA: Diagnosis not present

## 2023-05-13 DIAGNOSIS — Z148 Genetic carrier of other disease: Secondary | ICD-10-CM | POA: Insufficient documentation

## 2023-05-13 DIAGNOSIS — O99213 Obesity complicating pregnancy, third trimester: Secondary | ICD-10-CM | POA: Insufficient documentation

## 2023-05-13 DIAGNOSIS — O285 Abnormal chromosomal and genetic finding on antenatal screening of mother: Secondary | ICD-10-CM | POA: Diagnosis not present

## 2023-05-13 DIAGNOSIS — E669 Obesity, unspecified: Secondary | ICD-10-CM

## 2023-05-13 DIAGNOSIS — D563 Thalassemia minor: Secondary | ICD-10-CM | POA: Diagnosis not present

## 2023-05-13 DIAGNOSIS — Z3A38 38 weeks gestation of pregnancy: Secondary | ICD-10-CM | POA: Diagnosis not present

## 2023-05-23 ENCOUNTER — Inpatient Hospital Stay (HOSPITAL_COMMUNITY)
Admission: AD | Admit: 2023-05-23 | Discharge: 2023-05-27 | DRG: 788 | Disposition: A | Discharge status: Home / Self Care | Payer: Medicaid Other | Attending: Family Medicine | Admitting: Family Medicine

## 2023-05-23 ENCOUNTER — Encounter (HOSPITAL_COMMUNITY): Payer: Self-pay | Admitting: Obstetrics and Gynecology

## 2023-05-23 ENCOUNTER — Inpatient Hospital Stay (HOSPITAL_COMMUNITY): Admission: AD | Admit: 2023-05-23 | Payer: Medicaid Other | Source: Home / Self Care | Admitting: Family Medicine

## 2023-05-23 DIAGNOSIS — Z87891 Personal history of nicotine dependence: Secondary | ICD-10-CM | POA: Diagnosis not present

## 2023-05-23 DIAGNOSIS — Z98891 History of uterine scar from previous surgery: Principal | ICD-10-CM

## 2023-05-23 DIAGNOSIS — Z3480 Encounter for supervision of other normal pregnancy, unspecified trimester: Secondary | ICD-10-CM

## 2023-05-23 DIAGNOSIS — O34211 Maternal care for low transverse scar from previous cesarean delivery: Secondary | ICD-10-CM | POA: Diagnosis present

## 2023-05-23 DIAGNOSIS — O139 Gestational [pregnancy-induced] hypertension without significant proteinuria, unspecified trimester: Secondary | ICD-10-CM | POA: Diagnosis present

## 2023-05-23 DIAGNOSIS — O09523 Supervision of elderly multigravida, third trimester: Secondary | ICD-10-CM | POA: Diagnosis not present

## 2023-05-23 DIAGNOSIS — O09522 Supervision of elderly multigravida, second trimester: Secondary | ICD-10-CM

## 2023-05-23 DIAGNOSIS — D509 Iron deficiency anemia, unspecified: Secondary | ICD-10-CM | POA: Diagnosis not present

## 2023-05-23 DIAGNOSIS — O134 Gestational [pregnancy-induced] hypertension without significant proteinuria, complicating childbirth: Principal | ICD-10-CM | POA: Diagnosis present

## 2023-05-23 DIAGNOSIS — O9902 Anemia complicating childbirth: Secondary | ICD-10-CM | POA: Diagnosis present

## 2023-05-23 DIAGNOSIS — Z419 Encounter for procedure for purposes other than remedying health state, unspecified: Secondary | ICD-10-CM | POA: Diagnosis not present

## 2023-05-23 DIAGNOSIS — Z3A40 40 weeks gestation of pregnancy: Secondary | ICD-10-CM

## 2023-05-23 DIAGNOSIS — O480 Post-term pregnancy: Secondary | ICD-10-CM | POA: Diagnosis not present

## 2023-05-23 LAB — COMPREHENSIVE METABOLIC PANEL
ALT: 14 U/L (ref 0–44)
AST: 17 U/L (ref 15–41)
Albumin: 2.8 g/dL — ABNORMAL LOW (ref 3.5–5.0)
Alkaline Phosphatase: 141 U/L — ABNORMAL HIGH (ref 38–126)
Anion gap: 7 (ref 5–15)
BUN: 8 mg/dL (ref 6–20)
CO2: 22 mmol/L (ref 22–32)
Calcium: 9.5 mg/dL (ref 8.9–10.3)
Chloride: 105 mmol/L (ref 98–111)
Creatinine, Ser: 0.68 mg/dL (ref 0.44–1.00)
GFR, Estimated: >60 mL/min (ref >60)
Glucose, Bld: 102 mg/dL — ABNORMAL HIGH (ref 70–99)
Potassium: 3.5 mmol/L (ref 3.5–5.1)
Sodium: 134 mmol/L — ABNORMAL LOW (ref 135–145)
Total Bilirubin: <0.1 mg/dL — ABNORMAL LOW (ref 0.3–1.2)
Total Protein: 6.3 g/dL — ABNORMAL LOW (ref 6.5–8.1)

## 2023-05-23 LAB — CBC
HCT: 31 % — ABNORMAL LOW (ref 36.0–46.0)
Hemoglobin: 9.6 g/dL — ABNORMAL LOW (ref 12.0–15.0)
MCH: 22 pg — ABNORMAL LOW (ref 26.0–34.0)
MCHC: 31 g/dL (ref 30.0–36.0)
MCV: 70.9 fL — ABNORMAL LOW (ref 80.0–100.0)
Platelets: 251 10*3/uL (ref 150–400)
RBC: 4.37 MIL/uL (ref 3.87–5.11)
RDW: 15.7 % — ABNORMAL HIGH (ref 11.5–15.5)
WBC: 7.2 10*3/uL (ref 4.0–10.5)
nRBC: 0 % (ref 0.0–0.2)

## 2023-05-23 LAB — TYPE AND SCREEN
ABO/RH(D): O POS
Antibody Screen: NEGATIVE

## 2023-05-23 LAB — PROTEIN / CREATININE RATIO, URINE
Creatinine, Urine: 183 mg/dL
Protein Creatinine Ratio: 0.24 mg/mg{Cre} — ABNORMAL HIGH (ref 0.00–0.15)
Total Protein, Urine: 44 mg/dL

## 2023-05-23 MED ORDER — LACTATED RINGERS IV SOLN: INTRAVENOUS | Status: DC

## 2023-05-23 MED ORDER — OXYCODONE-ACETAMINOPHEN 5-325 MG PO TABS: 2.0000 | ORAL_TABLET | ORAL | Status: DC | PRN

## 2023-05-23 MED ORDER — LACTATED RINGERS IV SOLN: 500.0000 mL | INTRAVENOUS | Status: DC | PRN

## 2023-05-23 MED ORDER — MISOPROSTOL 25 MCG QUARTER TABLET
25.0000 ug | ORAL_TABLET | Freq: Once | ORAL | Status: AC
  Administered 2023-05-23: 25 ug via VAGINAL

## 2023-05-23 MED ORDER — OXYTOCIN BOLUS FROM INFUSION: 333.0000 mL | Freq: Once | INTRAVENOUS | Status: DC

## 2023-05-23 MED ORDER — ACETAMINOPHEN 325 MG PO TABS: 650.0000 mg | ORAL_TABLET | ORAL | Status: DC | PRN

## 2023-05-23 MED ORDER — ONDANSETRON HCL 4 MG/2ML IJ SOLN
4.0000 mg | Freq: Four times a day (QID) | INTRAMUSCULAR | Status: DC | PRN
  Administered 2023-05-24: 4 mg via INTRAVENOUS
  Filled 2023-05-23: qty 2

## 2023-05-23 MED ORDER — MISOPROSTOL 50MCG HALF TABLET
50.0000 ug | ORAL_TABLET | Freq: Once | ORAL | Status: AC
  Administered 2023-05-23: 50 ug via ORAL
  Filled 2023-05-23: qty 1

## 2023-05-23 MED ORDER — TERBUTALINE SULFATE 1 MG/ML IJ SOLN
0.2500 mg | Freq: Once | INTRAMUSCULAR | Status: DC | PRN
  Filled 2023-05-23: qty 1

## 2023-05-23 MED ORDER — OXYTOCIN-SODIUM CHLORIDE 30-0.9 UT/500ML-% IV SOLN
2.5000 [IU]/h | INTRAVENOUS | Status: DC
  Filled 2023-05-23: qty 500

## 2023-05-23 MED ORDER — LIDOCAINE HCL (PF) 1 % IJ SOLN: 30.0000 mL | INTRAMUSCULAR | Status: DC | PRN

## 2023-05-23 MED ORDER — OXYCODONE-ACETAMINOPHEN 5-325 MG PO TABS: 1.0000 | ORAL_TABLET | ORAL | Status: DC | PRN

## 2023-05-23 MED ORDER — SOD CITRATE-CITRIC ACID 500-334 MG/5ML PO SOLN
30.0000 mL | ORAL | Status: DC | PRN
  Administered 2023-05-24: 30 mL via ORAL
  Filled 2023-05-23: qty 30

## 2023-05-23 NOTE — MAU Note (Signed)
Pt informed that the ultrasound is considered a limited OB ultrasound and is not intended to be a complete ultrasound exam.  Patient also informed that the ultrasound is not being completed with the intent of assessing for fetal or placental anomalies or any pelvic abnormalities.  Explained that the purpose of today's ultrasound is to assess for presentation.  Patient acknowledges the purpose of the exam and the limitations of the study.    Vertex presentation confirmed 

## 2023-05-23 NOTE — H&P (Addendum)
OBSTETRIC ADMISSION HISTORY AND PHYSICAL  Christine Hudson is a 37 y.o. female [phone] with IUP at [redacted]w[redacted]d by LMP presented to MAU for ctx every 10-15 minutes. She reports +FMs, No LOF, no VB, no blurry vision, headaches or peripheral edema, or RUQ pain.   BP 140/62 at triage, agreed to IOL for gestational hypertension. Pt notes two previous vaginal deliveries that were induced with pitocin, states that her labor progressed slowly at those times.  She plans on breast feeding. She is undecided on birth control. She received her prenatal care at [facility]   Dating: By LMP --->  Estimated Date of Delivery: 05/23/23  Sono:    @[redacted]w[redacted]d , CWD, normal anatomy, cephalic presentation, 3635g, 45% EFW   Prenatal History/Complications: AMA, BMI 33  Past Medical History: Past Medical History:  Diagnosis Date   Medical history non-contributory    No pertinent past medical history     Past Surgical History: Past Surgical History:  Procedure Laterality Date   DILATION AND CURETTAGE OF UTERUS     INDUCED ABORTION      Obstetrical History: OB History     Gravida  5   Para  2   Term  2   Preterm      AB  2   Living  2      SAB      IAB  2   Ectopic      Multiple      Live Births  2           Social History Social History   Socioeconomic History   Marital status: Single    Spouse name: Not on file   Number of children: Not on file   Years of education: Not on file   Highest education level: GED or equivalent  Occupational History   Not on file  Tobacco Use   Smoking status: Former    Packs/day: .25    Types: Cigarettes    Quit date: 01/21/2023    Years since quitting: 0.3   Smokeless tobacco: Not on file  Vaping Use   Vaping Use: Never used  Substance and Sexual Activity   Alcohol use: Not Currently    Comment: occ   Drug use: Never   Sexual activity: Not Currently    Birth control/protection: None    Comment: pregnant  Other Topics Concern   Not on file   Social History Narrative   Not on file   Social Determinants of Health   Financial Resource Strain: Low Risk  (03/31/2023)   Overall Financial Resource Strain (CARDIA)    Difficulty of Paying Living Expenses: Not hard at all  Food Insecurity: No Food Insecurity (05/23/2023)   Hunger Vital Sign    Worried About Running Out of Food in the Last Year: Never true    Ran Out of Food in the Last Year: Never true  Transportation Needs: No Transportation Needs (05/23/2023)   PRAPARE - Administrator, Civil Service (Medical): No    Lack of Transportation (Non-Medical): No  Physical Activity: Insufficiently Active (03/31/2023)   Exercise Vital Sign    Days of Exercise per Week: 2 days    Minutes of Exercise per Session: 20 min  Stress: No Stress Concern Present (03/31/2023)   [employer] of Occupational Health - Occupational Stress Questionnaire    Feeling of Stress : Not at all  Social Connections: Moderately Integrated (03/31/2023)   Social Connection and Isolation Panel [NHANES]    Frequency of  Communication with Friends and Family: Once a week    Frequency of Social Gatherings with Friends and Family: Once a week    Attends Religious Services: 1 to 4 times per year    Active Member of [employer] or Organizations: Yes    Attends Banker Meetings: 1 to 4 times per year    Marital Status: Living with partner    Family History: Family History  Problem Relation Age of Onset   Asthma Mother    Diabetes Mother    Cancer Mother    Hypertension Father    Cancer Maternal Grandmother    Other Neg Hx    Heart disease Neg Hx     Allergies: No Known Allergies  Medications Prior to Admission  Medication Sig Dispense Refill Last Dose   acetaminophen (TYLENOL) 500 MG tablet Take 1,000 mg by mouth every 6 (six) hours as needed (for pain.).   05/22/2023   Prenatal Vit-Fe Fumarate-FA (PRENATAL MULTIVITAMIN) TABS tablet Take 1 tablet by mouth daily at 12 noon.   05/22/2023      Review of Systems   All systems reviewed and negative except as stated in HPI  Blood pressure 136/77, pulse 82, temperature 99.2 F (37.3 C), temperature source Oral, resp. rate 15, height 5\' 4"  (1.626 m), weight 93.9 kg, last menstrual period 08/16/2022, unknown if currently breastfeeding. 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 Anatomy US 05/13/23, normal  Prenatal Transfer Tool  Maternal Diabetes: No 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, NIPS LR Maternal Ultrasounds/Referrals: Normal Fetal Ultrasounds or other Referrals:  Referred to Materal Fetal Medicine , AMA Maternal Substance Abuse:  No Significant Maternal Medications:  None Significant Maternal Lab Results:  Group B Strep negative Number of Prenatal Visits:greater than 3 verified prenatal visits Other Comments:  None  Results for orders placed or performed during the hospital encounter of 05/23/23 (from the past 24 hour(s))  Type and screen [facility]   Collection Time: 05/23/23  1:20 PM  Result Value Ref Range   ABO/RH(D) O POS    Antibody Screen NEG    Sample Expiration      05/26/2023,2359 Performed at [facility], [address], [place], [place] [address]   CBC   Collection Time: 05/23/23  1:21 PM  Result Value Ref Range   WBC 7.2 4.0 - 10.5 K/uL   RBC 4.37 3.87 - 5.11 MIL/uL   Hemoglobin 9.6 (L) 12.0 - 15.0 g/dL   HCT 56.2 (L) 13.0 - 86.5 %   MCV  70.9 (L) 80.0 - 100.0 fL   MCH 22.0 (L) 26.0 - 34.0 pg   MCHC 31.0 30.0 - 36.0 g/dL   RDW 78.4 (H) 69.6 - 29.5 %   Platelets 251 150 - 400 K/uL   nRBC 0.0 0.0 - 0.2 %  Comprehensive metabolic panel   Collection Time: 05/23/23  1:21 PM  Result Value Ref Range   Sodium 134 (L) 135 - 145 mmol/L   Potassium 3.5 3.5 - 5.1  mmol/L   Chloride 105 98 - 111 mmol/L   CO2 22 22 - 32 mmol/L   Glucose, Bld 102 (H) 70 - 99 mg/dL   BUN 8 6 - 20 mg/dL   Creatinine, Ser 1.61 0.44 - 1.00 mg/dL   Calcium 9.5 8.9 - 09.6 mg/dL   Total Protein 6.3 (L) 6.5 - 8.1 g/dL   Albumin 2.8 (L) 3.5 - 5.0 g/dL   AST 17 15 - 41 U/L   ALT 14 0 - 44 U/L   Alkaline Phosphatase 141 (H) 38 - 126 U/L   Total Bilirubin <0.1 (L) 0.3 - 1.2 mg/dL   GFR, Estimated >04 >54 mL/min   Anion gap 7 5 - 15  Protein / creatinine ratio, urine   Collection Time: 05/23/23  2:31 PM  Result Value Ref Range   Creatinine, Urine 183 mg/dL   Total Protein, Urine 44 mg/dL   Protein Creatinine Ratio 0.24 (H) 0.00 - 0.15 mg/mg[Cre]    Patient Active Problem List   Diagnosis Date Noted   Gestational hypertension 05/23/2023   Supervision of other normal pregnancy, antepartum 02/14/2023   AMA (advanced maternal age) multigravida 35+, second trimester 02/14/2023    Assessment/Plan:  Christine Hudson is a 37 y.o. [id number] at [redacted]w[redacted]d here for IOL secondary to gHTN  #Labor: cytotec 25mg  vaginally 50mg  orally given to start progression #Pain: none #FWB: Cat 1 #ID:  GBS neg #MOF: breast #MOC: undecided   Kirstie Everhart, DO  05/23/2023, 5:40 PM  CNM attestation:  I have seen and examined this patient; I agree with above documentation in the resident's note.   Christine Hudson is a 37 y.o. [id number] here for IOL due to new onset gHTN (neg labs; mild range elevations without symptoms)  PE: BP 136/77   Pulse 82   Temp 99.2 F (37.3 C) (Oral)   Resp 15   Ht 5\' 4"  (1.626 m)   Wt 93.9 kg   LMP 08/16/2022   BMI 35.53 kg/m  Gen: calm  comfortable, NAD Resp: normal effort, no distress Abd: gravid  ROS, labs, PMH reviewed  Plan: -Admit to L&D -Plan cervical ripening with dual dose cytotec to start, then re-eval in 4 hours for either repeat cytotec or Pitocin -Continue to monitor BPs -Anticipate vag delivery  Christine Hudson CNM 05/23/2023, 6:28 PM

## 2023-05-23 NOTE — Progress Notes (Signed)
Patient meets criteria for gestational hypertension.   Informed that this means waterbirth is not an option for delivery. Patient voiced understanding Patient agrees to IOL for gestational hyperstension Orders placed.   Federico Flake, MD

## 2023-05-23 NOTE — MAU Note (Signed)
.  Christine Hudson is a 37 y.o. at [redacted]w[redacted]d here in MAU reporting: ctx every 10-15 minutes. Denies VB or LOF. +FM.   Pain score: 3 Vitals:   05/23/23 1225  BP: (!) 140/62  Pulse: 89  Resp: 14  Temp: 97.9 F (36.6 C)     FHT:133 Lab orders placed from triage:  MAU labor

## 2023-05-24 ENCOUNTER — Other Ambulatory Visit: Payer: Self-pay

## 2023-05-24 ENCOUNTER — Inpatient Hospital Stay (HOSPITAL_COMMUNITY): Payer: Medicaid Other | Admitting: Anesthesiology

## 2023-05-24 ENCOUNTER — Encounter (HOSPITAL_COMMUNITY): Payer: Self-pay | Admitting: Family Medicine

## 2023-05-24 ENCOUNTER — Encounter (HOSPITAL_COMMUNITY)
Admission: AD | Disposition: A | Discharge status: Home / Self Care | Payer: Self-pay | Source: Home / Self Care | Attending: Family Medicine

## 2023-05-24 DIAGNOSIS — Z3A40 40 weeks gestation of pregnancy: Secondary | ICD-10-CM

## 2023-05-24 DIAGNOSIS — O480 Post-term pregnancy: Secondary | ICD-10-CM

## 2023-05-24 DIAGNOSIS — O134 Gestational [pregnancy-induced] hypertension without significant proteinuria, complicating childbirth: Secondary | ICD-10-CM | POA: Diagnosis not present

## 2023-05-24 DIAGNOSIS — O09523 Supervision of elderly multigravida, third trimester: Secondary | ICD-10-CM | POA: Diagnosis not present

## 2023-05-24 LAB — CBC
HCT: 30.1 % — ABNORMAL LOW (ref 36.0–46.0)
HCT: 27.2 % — ABNORMAL LOW (ref 36.0–46.0)
Hemoglobin: 9.4 g/dL — ABNORMAL LOW (ref 12.0–15.0)
Hemoglobin: 8.3 g/dL — ABNORMAL LOW (ref 12.0–15.0)
MCH: 22.5 pg — ABNORMAL LOW (ref 26.0–34.0)
MCH: 21.8 pg — ABNORMAL LOW (ref 26.0–34.0)
MCHC: 31.2 g/dL (ref 30.0–36.0)
MCHC: 30.5 g/dL (ref 30.0–36.0)
MCV: 72 fL — ABNORMAL LOW (ref 80.0–100.0)
MCV: 71.4 fL — ABNORMAL LOW (ref 80.0–100.0)
Platelets: 237 10*3/uL (ref 150–400)
Platelets: 212 10*3/uL (ref 150–400)
RBC: 4.18 MIL/uL (ref 3.87–5.11)
RBC: 3.81 MIL/uL — ABNORMAL LOW (ref 3.87–5.11)
RDW: 15.8 % — ABNORMAL HIGH (ref 11.5–15.5)
RDW: 15.6 % — ABNORMAL HIGH (ref 11.5–15.5)
WBC: 11 10*3/uL — ABNORMAL HIGH (ref 4.0–10.5)
WBC: 14.8 10*3/uL — ABNORMAL HIGH (ref 4.0–10.5)
nRBC: 0 % (ref 0.0–0.2)
nRBC: 0 % (ref 0.0–0.2)

## 2023-05-24 LAB — RPR: RPR Ser Ql: NONREACTIVE

## 2023-05-24 SURGERY — Surgical Case
Anesthesia: Epidural

## 2023-05-24 MED ORDER — OXYTOCIN-SODIUM CHLORIDE 30-0.9 UT/500ML-% IV SOLN
INTRAVENOUS | Status: DC | PRN
  Administered 2023-05-24: 300 mL via INTRAVENOUS

## 2023-05-24 MED ORDER — NALOXONE HCL 0.4 MG/ML IJ SOLN: 0.4000 mg | INTRAMUSCULAR | Status: DC | PRN

## 2023-05-24 MED ORDER — PHENYLEPHRINE 80 MCG/ML (10ML) SYRINGE FOR IV PUSH (FOR BLOOD PRESSURE SUPPORT): 80.0000 ug | PREFILLED_SYRINGE | INTRAVENOUS | Status: DC | PRN

## 2023-05-24 MED ORDER — WITCH HAZEL-GLYCERIN EX PADS: 1.0000 | MEDICATED_PAD | CUTANEOUS | Status: DC | PRN

## 2023-05-24 MED ORDER — ONDANSETRON HCL 4 MG/2ML IJ SOLN: 4.0000 mg | Freq: Three times a day (TID) | INTRAMUSCULAR | Status: DC | PRN

## 2023-05-24 MED ORDER — FENTANYL CITRATE (PF) 100 MCG/2ML IJ SOLN: 25.0000 ug | INTRAMUSCULAR | Status: DC | PRN

## 2023-05-24 MED ORDER — OXYTOCIN-SODIUM CHLORIDE 30-0.9 UT/500ML-% IV SOLN
1.0000 m[IU]/min | INTRAVENOUS | Status: DC
  Administered 2023-05-24: 2 m[IU]/min via INTRAVENOUS

## 2023-05-24 MED ORDER — OXYTOCIN-SODIUM CHLORIDE 30-0.9 UT/500ML-% IV SOLN
INTRAVENOUS | Status: AC
  Filled 2023-05-24: qty 500

## 2023-05-24 MED ORDER — FENTANYL CITRATE (PF) 100 MCG/2ML IJ SOLN
INTRAMUSCULAR | Status: DC | PRN
  Administered 2023-05-24: 100 ug via EPIDURAL

## 2023-05-24 MED ORDER — SODIUM CHLORIDE 0.9 % IR SOLN
Status: DC | PRN
  Administered 2023-05-24: 1

## 2023-05-24 MED ORDER — OXYTOCIN-SODIUM CHLORIDE 30-0.9 UT/500ML-% IV SOLN
2.5000 [IU]/h | INTRAVENOUS | Status: AC
  Administered 2023-05-24: 2.5 [IU]/h via INTRAVENOUS

## 2023-05-24 MED ORDER — MEPERIDINE HCL 25 MG/ML IJ SOLN: 6.2500 mg | INTRAMUSCULAR | Status: DC | PRN

## 2023-05-24 MED ORDER — TRANEXAMIC ACID-NACL 1000-0.7 MG/100ML-% IV SOLN
INTRAVENOUS | Status: DC | PRN
  Administered 2023-05-24: 1000 mg via INTRAVENOUS

## 2023-05-24 MED ORDER — PHENYLEPHRINE HCL (PRESSORS) 10 MG/ML IV SOLN
INTRAVENOUS | Status: DC | PRN
  Administered 2023-05-24 (×2): 160 ug via INTRAVENOUS

## 2023-05-24 MED ORDER — MORPHINE SULFATE (PF) 0.5 MG/ML IJ SOLN
INTRAMUSCULAR | Status: DC | PRN
  Administered 2023-05-24: 3 mg via EPIDURAL

## 2023-05-24 MED ORDER — SIMETHICONE 80 MG PO CHEW
80.0000 mg | CHEWABLE_TABLET | Freq: Three times a day (TID) | ORAL | Status: DC
  Administered 2023-05-25 – 2023-05-26 (×6): 80 mg via ORAL
  Filled 2023-05-24 (×6): qty 1

## 2023-05-24 MED ORDER — SIMETHICONE 80 MG PO CHEW: 80.0000 mg | CHEWABLE_TABLET | ORAL | Status: DC | PRN

## 2023-05-24 MED ORDER — COCONUT OIL OIL: 1.0000 | TOPICAL_OIL | Status: DC | PRN

## 2023-05-24 MED ORDER — KETOROLAC TROMETHAMINE 30 MG/ML IJ SOLN
30.0000 mg | Freq: Four times a day (QID) | INTRAMUSCULAR | Status: AC
  Administered 2023-05-24 – 2023-05-25 (×4): 30 mg via INTRAVENOUS
  Filled 2023-05-24 (×4): qty 1

## 2023-05-24 MED ORDER — STERILE WATER FOR IRRIGATION IR SOLN
Status: DC | PRN
  Administered 2023-05-24: 1000 mL

## 2023-05-24 MED ORDER — SENNOSIDES-DOCUSATE SODIUM 8.6-50 MG PO TABS
2.0000 | ORAL_TABLET | Freq: Every day | ORAL | Status: DC
  Administered 2023-05-25 – 2023-05-26 (×2): 2 via ORAL
  Filled 2023-05-24 (×2): qty 2

## 2023-05-24 MED ORDER — MENTHOL 3 MG MT LOZG: 1.0000 | LOZENGE | OROMUCOSAL | Status: DC | PRN

## 2023-05-24 MED ORDER — METOCLOPRAMIDE HCL 5 MG/ML IJ SOLN
INTRAMUSCULAR | Status: AC
  Filled 2023-05-24: qty 2

## 2023-05-24 MED ORDER — ZOLPIDEM TARTRATE 5 MG PO TABS: 5.0000 mg | ORAL_TABLET | Freq: Every evening | ORAL | Status: DC | PRN

## 2023-05-24 MED ORDER — EPHEDRINE 5 MG/ML INJ: 10.0000 mg | INTRAVENOUS | Status: DC | PRN

## 2023-05-24 MED ORDER — ENOXAPARIN SODIUM 40 MG/0.4ML IJ SOSY
40.0000 mg | PREFILLED_SYRINGE | INTRAMUSCULAR | Status: DC
  Administered 2023-05-25 – 2023-05-26 (×2): 40 mg via SUBCUTANEOUS
  Filled 2023-05-24 (×2): qty 0.4

## 2023-05-24 MED ORDER — ERYTHROMYCIN 5 MG/GM OP OINT
TOPICAL_OINTMENT | OPHTHALMIC | Status: AC
  Filled 2023-05-24: qty 1

## 2023-05-24 MED ORDER — LACTATED RINGERS IV SOLN
500.0000 mL | Freq: Once | INTRAVENOUS | Status: AC
  Administered 2023-05-24 (×2): 500 mL via INTRAVENOUS

## 2023-05-24 MED ORDER — LACTATED RINGERS IV SOLN: INTRAVENOUS | Status: DC | PRN

## 2023-05-24 MED ORDER — NALOXONE HCL 4 MG/10ML IJ SOLN: 1.0000 ug/kg/h | INTRAVENOUS | Status: DC | PRN

## 2023-05-24 MED ORDER — SODIUM CHLORIDE 0.9 % IV SOLN
INTRAVENOUS | Status: AC
  Filled 2023-05-24: qty 5

## 2023-05-24 MED ORDER — ACETAMINOPHEN 10 MG/ML IV SOLN
INTRAVENOUS | Status: DC | PRN
  Administered 2023-05-24: 1000 mg via INTRAVENOUS

## 2023-05-24 MED ORDER — DIPHENHYDRAMINE HCL 25 MG PO CAPS: 25.0000 mg | ORAL_CAPSULE | ORAL | Status: DC | PRN

## 2023-05-24 MED ORDER — DEXAMETHASONE SODIUM PHOSPHATE 4 MG/ML IJ SOLN
INTRAMUSCULAR | Status: AC
  Filled 2023-05-24: qty 1

## 2023-05-24 MED ORDER — OXYCODONE HCL 5 MG PO TABS
5.0000 mg | ORAL_TABLET | ORAL | Status: DC | PRN
  Administered 2023-05-25 – 2023-05-27 (×3): 5 mg via ORAL
  Filled 2023-05-24 (×3): qty 1

## 2023-05-24 MED ORDER — SCOPOLAMINE 1 MG/3DAYS TD PT72
MEDICATED_PATCH | TRANSDERMAL | Status: AC
  Filled 2023-05-24: qty 1

## 2023-05-24 MED ORDER — SCOPOLAMINE 1 MG/3DAYS TD PT72
MEDICATED_PATCH | TRANSDERMAL | Status: DC | PRN
  Administered 2023-05-24: 1 via TRANSDERMAL

## 2023-05-24 MED ORDER — ACETAMINOPHEN 10 MG/ML IV SOLN
INTRAVENOUS | Status: AC
  Filled 2023-05-24: qty 100

## 2023-05-24 MED ORDER — METOCLOPRAMIDE HCL 5 MG/ML IJ SOLN
INTRAMUSCULAR | Status: DC | PRN
  Administered 2023-05-24: 10 mg via INTRAVENOUS

## 2023-05-24 MED ORDER — FENTANYL CITRATE (PF) 100 MCG/2ML IJ SOLN
INTRAMUSCULAR | Status: AC
  Filled 2023-05-24: qty 2

## 2023-05-24 MED ORDER — PROMETHAZINE HCL 25 MG/ML IJ SOLN: 6.2500 mg | INTRAMUSCULAR | Status: DC | PRN

## 2023-05-24 MED ORDER — DIPHENHYDRAMINE HCL 25 MG PO CAPS: 25.0000 mg | ORAL_CAPSULE | Freq: Four times a day (QID) | ORAL | Status: DC | PRN

## 2023-05-24 MED ORDER — DEXAMETHASONE SODIUM PHOSPHATE 4 MG/ML IJ SOLN
INTRAMUSCULAR | Status: DC | PRN
  Administered 2023-05-24: 8 mg via INTRAVENOUS

## 2023-05-24 MED ORDER — KETOROLAC TROMETHAMINE 30 MG/ML IJ SOLN
INTRAMUSCULAR | Status: AC
  Filled 2023-05-24: qty 1

## 2023-05-24 MED ORDER — DIPHENHYDRAMINE HCL 50 MG/ML IJ SOLN: 12.5000 mg | INTRAMUSCULAR | Status: DC | PRN

## 2023-05-24 MED ORDER — SCOPOLAMINE 1 MG/3DAYS TD PT72: 1.0000 | MEDICATED_PATCH | Freq: Once | TRANSDERMAL | Status: DC

## 2023-05-24 MED ORDER — FENTANYL-BUPIVACAINE-NACL 0.5-0.125-0.9 MG/250ML-% EP SOLN
12.0000 mL/h | EPIDURAL | Status: DC | PRN
  Administered 2023-05-24: 12 mL/h via EPIDURAL
  Filled 2023-05-24: qty 250

## 2023-05-24 MED ORDER — SODIUM CHLORIDE 0.9% FLUSH: 3.0000 mL | INTRAVENOUS | Status: DC | PRN

## 2023-05-24 MED ORDER — ONDANSETRON HCL 4 MG/2ML IJ SOLN
INTRAMUSCULAR | Status: AC
  Filled 2023-05-24: qty 2

## 2023-05-24 MED ORDER — ACETAMINOPHEN 500 MG PO TABS: 1000.0000 mg | ORAL_TABLET | Freq: Four times a day (QID) | ORAL | Status: DC

## 2023-05-24 MED ORDER — ACETAMINOPHEN 500 MG PO TABS
1000.0000 mg | ORAL_TABLET | Freq: Four times a day (QID) | ORAL | Status: DC
  Administered 2023-05-24 – 2023-05-27 (×9): 1000 mg via ORAL
  Filled 2023-05-24 (×10): qty 2

## 2023-05-24 MED ORDER — DIBUCAINE (PERIANAL) 1 % EX OINT: 1.0000 | TOPICAL_OINTMENT | CUTANEOUS | Status: DC | PRN

## 2023-05-24 MED ORDER — KETOROLAC TROMETHAMINE 30 MG/ML IJ SOLN: 30.0000 mg | Freq: Four times a day (QID) | INTRAMUSCULAR | Status: AC | PRN

## 2023-05-24 MED ORDER — DEXMEDETOMIDINE HCL IN NACL 80 MCG/20ML IV SOLN
INTRAVENOUS | Status: AC
  Filled 2023-05-24: qty 20

## 2023-05-24 MED ORDER — SODIUM CHLORIDE 0.9 % IV SOLN
INTRAVENOUS | Status: DC | PRN
  Administered 2023-05-24: 500 mg via INTRAVENOUS

## 2023-05-24 MED ORDER — PRENATAL MULTIVITAMIN CH
1.0000 | ORAL_TABLET | Freq: Every day | ORAL | Status: DC
  Administered 2023-05-25 – 2023-05-26 (×2): 1 via ORAL
  Filled 2023-05-24 (×2): qty 1

## 2023-05-24 MED ORDER — IBUPROFEN 600 MG PO TABS
600.0000 mg | ORAL_TABLET | Freq: Four times a day (QID) | ORAL | Status: DC
  Administered 2023-05-25 – 2023-05-27 (×6): 600 mg via ORAL
  Filled 2023-05-24 (×6): qty 1

## 2023-05-24 MED ORDER — LACTATED RINGERS AMNIOINFUSION: INTRAVENOUS | Status: DC

## 2023-05-24 MED ORDER — MORPHINE SULFATE (PF) 0.5 MG/ML IJ SOLN
INTRAMUSCULAR | Status: AC
  Filled 2023-05-24: qty 10

## 2023-05-24 MED ORDER — VITAMIN K1 1 MG/0.5ML IJ SOLN
INTRAMUSCULAR | Status: AC
  Filled 2023-05-24: qty 0.5

## 2023-05-24 MED ORDER — TERBUTALINE SULFATE 1 MG/ML IJ SOLN
0.2500 mg | Freq: Once | INTRAMUSCULAR | Status: AC | PRN
  Administered 2023-05-24: 0.25 mg via SUBCUTANEOUS

## 2023-05-24 MED ORDER — CEFAZOLIN SODIUM-DEXTROSE 2-3 GM-%(50ML) IV SOLR
INTRAVENOUS | Status: DC | PRN
  Administered 2023-05-24: 2 g via INTRAVENOUS

## 2023-05-24 MED ORDER — KETOROLAC TROMETHAMINE 30 MG/ML IJ SOLN
30.0000 mg | Freq: Four times a day (QID) | INTRAMUSCULAR | Status: AC | PRN
  Administered 2023-05-24: 30 mg via INTRAVENOUS

## 2023-05-24 MED ORDER — LIDOCAINE-EPINEPHRINE (PF) 2 %-1:200000 IJ SOLN
INTRAMUSCULAR | Status: DC | PRN
  Administered 2023-05-24: 5 mL via EPIDURAL
  Administered 2023-05-24: 12 mL via EPIDURAL
  Administered 2023-05-24: 5 mL via EPIDURAL

## 2023-05-24 MED ORDER — LIDOCAINE HCL (PF) 1 % IJ SOLN
INTRAMUSCULAR | Status: DC | PRN
  Administered 2023-05-24: 5 mL via EPIDURAL

## 2023-05-24 MED ORDER — PHENYLEPHRINE 80 MCG/ML (10ML) SYRINGE FOR IV PUSH (FOR BLOOD PRESSURE SUPPORT)
PREFILLED_SYRINGE | INTRAVENOUS | Status: AC
  Filled 2023-05-24: qty 10

## 2023-05-24 MED ORDER — ONDANSETRON HCL 4 MG/2ML IJ SOLN
INTRAMUSCULAR | Status: DC | PRN
  Administered 2023-05-24: 4 mg via INTRAVENOUS

## 2023-05-24 SURGICAL SUPPLY — 31 items
ADH SKN CLS APL DERMABOND .7 (GAUZE/BANDAGES/DRESSINGS) ×2
APL PRP STRL LF DISP 70% ISPRP (MISCELLANEOUS) ×2
CHLORAPREP W/TINT 26 (MISCELLANEOUS) ×2 IMPLANT
CLAMP UMBILICAL CORD (MISCELLANEOUS) ×1 IMPLANT
CLOTH BEACON ORANGE TIMEOUT ST (SAFETY) ×1 IMPLANT
DERMABOND ADVANCED .7 DNX12 (GAUZE/BANDAGES/DRESSINGS) ×2 IMPLANT
DRSG OPSITE POSTOP 4X10 (GAUZE/BANDAGES/DRESSINGS) ×1 IMPLANT
ELECT REM PT RETURN 9FT ADLT (ELECTROSURGICAL) ×1
ELECTRODE REM PT RTRN 9FT ADLT (ELECTROSURGICAL) ×1 IMPLANT
EXTRACTOR VACUUM M CUP 4 TUBE (SUCTIONS) IMPLANT
GAUZE SPONGE 4X4 12PLY STRL LF (GAUZE/BANDAGES/DRESSINGS) IMPLANT
GLOVE BIOGEL PI IND STRL 7.0 (GLOVE) ×2 IMPLANT
GLOVE BIOGEL PI IND STRL 7.5 (GLOVE) ×2 IMPLANT
GLOVE ECLIPSE 7.5 STRL STRAW (GLOVE) ×1 IMPLANT
GOWN STRL REUS W/TWL LRG LVL3 (GOWN DISPOSABLE) ×3 IMPLANT
KIT ABG SYR 3ML LUER SLIP (SYRINGE) IMPLANT
NDL HYPO 25X5/8 SAFETYGLIDE (NEEDLE) IMPLANT
NEEDLE HYPO 25X5/8 SAFETYGLIDE (NEEDLE) IMPLANT
NS IRRIG 1000ML POUR BTL (IV SOLUTION) ×1 IMPLANT
PACK C SECTION WH (CUSTOM PROCEDURE TRAY) ×1 IMPLANT
PAD OB MATERNITY 4.3X12.25 (PERSONAL CARE ITEMS) ×1 IMPLANT
RTRCTR C-SECT PINK 25CM LRG (MISCELLANEOUS) ×1 IMPLANT
SUT MNCRL 0 VIOLET CTX 36 (SUTURE) ×2 IMPLANT
SUT VIC AB 0 CTX 36 (SUTURE) ×1
SUT VIC AB 0 CTX36XBRD ANBCTRL (SUTURE) ×1 IMPLANT
SUT VIC AB 2-0 CT1 27 (SUTURE) ×1
SUT VIC AB 2-0 CT1 TAPERPNT 27 (SUTURE) ×1 IMPLANT
SUT VIC AB 4-0 KS 27 (SUTURE) ×1 IMPLANT
TOWEL OR 17X24 6PK STRL BLUE (TOWEL DISPOSABLE) ×1 IMPLANT
TRAY FOLEY W/BAG SLVR 14FR LF (SET/KITS/TRAYS/PACK) ×1 IMPLANT
WATER STERILE IRR 1000ML POUR (IV SOLUTION) ×1 IMPLANT

## 2023-05-24 NOTE — Op Note (Signed)
Operative Note   Patient: Christine Hudson  Date of Procedure: 05/23/2023 - 05/24/2023  Procedure: Primary Low Transverse Cesarean   Indications: non-reassuring fetal status  Pre-operative Diagnosis: Cesarean Section for nonreassuring fetal heart rate.   Post-operative Diagnosis: Same  TOLAC Candidate: Yes   Surgeon: Surgeon(s) and Role:    * Stinson, Rhona Raider, DO - Primary    * Rennie Hack, Cyndi Lennert, MD - Assisting  Assistants: none  An experienced assistant was required given the standard of surgical care given the complexity of the case.  This assistant was needed for exposure, dissection, suctioning, retraction, instrument exchange, assisting with delivery with administration of fundal pressure, and for overall help during the procedure.   Anesthesia: epidural  Anesthesiologist: No responsible provider has been recorded for the case.   Antibiotics: Cefazolin and Azithromycin   Estimated Blood Loss: 673 ml   Total IV Fluids: 2000 ml  Urine Output:  150 cc OF clear urine  Specimens: Placenta    Complications: no complications   Indications: Christine Hudson is a 37 y.o. [name] with an IUP [redacted]w[redacted]d presenting for unscheduled, urgent cesarean secondary to the indications listed above. Clinical course notable for progression to 8 cm after Pitocin and AROM.  Infant had nonreassuring fetal heart tracing with prolonged deceleration.  Terbutaline was given, Pitocin was stopped, position changes and patient was given a fluid bolus.  Fetus recovered.  Patient continued to have deep variable decelerations with contractions not on Pitocin.  The strip improved but when patient moved to back for cervical exam patient had another prolonged deceleration.  The risks of cesarean section discussed with the patient included but were not limited to: bleeding which may require transfusion or reoperation; infection which may require antibiotics; injury to bowel, bladder, ureters or other surrounding organs;  injury to the fetus; need for additional procedures including hysterectomy in the event of a life-threatening hemorrhage; placental abnormalities with subsequent pregnancies, incisional problems, thromboembolic phenomenon and other postoperative/anesthesia complications. The patient concurred with the proposed plan, giving informed written consent for the procedure. Patient NPO status waived given urgency of case. Anesthesia and OR aware. Preoperative prophylactic antibiotics and SCDs ordered on call to the OR.   Findings: Viable infant in cephalic presentation direct OP, no nuchal cord present. Apgars 8 , 9 , . Weight 3544 g . Clear amniotic fluid. Normal placenta, three vessel cord. Normal uterus, Normal bilateral fallopian tubes, Normal bilateral ovaries. No adhesive disease was encountered.  Procedure Details: A Time Out was held and the above information confirmed. The patient received intravenous antibiotics and had sequential compression devices applied to her lower extremities preoperatively. The patient was taken back to the operative suite where epidural anesthesia was administered. After induction of anesthesia, the patient was draped and prepped in the usual sterile manner and placed in a dorsal supine position with a leftward tilt. A low transverse skin incision was made with scalpel and carried down through the subcutaneous tissue to the fascia. Fascial incision was made and extended transversely. The fascia was separated from the underlying rectus tissue superiorly and inferiorly. The rectus muscles were separated in the midline bluntly and the peritoneum was entered bluntly. An Alexis retractor was placed to aid in visualization of the uterus. A bladder flap was not developed. A low transverse uterine incision was made. The infant was successfully delivered from cephalic presentation, the umbilical cord was clamped after 1 minute. Cord ph was not sent, and cord blood was obtained for evaluation.  The placenta was removed  Intact and appeared normal. The uterine incision was closed with a single layer running unlocked suture of 0-Monocryl. Due to ongoing bleeding from the hysterotomy figure of eight sutures of 0 Monocryl were placed after which there was excellent hemostasis. The abdomen and the pelvis were cleared of all clot and debris and the Jon Gills was removed. Hemostasis was confirmed on all surfaces.  The peritoneum was reapproximated using 2-0 vicryl . The fascia was then closed using 0 Vicryl in a running fashion. The subcutaneous layer was not reapproximated. The skin was closed with a 4-0 vicryl subcuticular stitch. The patient tolerated the procedure well. Sponge, lap, instrument and needle counts were correct x 2. She was taken to the recovery room in stable condition.  Disposition: PACU - hemodynamically stable.    Signed: Celedonio Savage, MD [facility] Fellow  Center for [facility], [facility]

## 2023-05-24 NOTE — Progress Notes (Signed)
Labor Progress Note Christine Hudson is a 37 y.o. [name] at [redacted]w[redacted]d presented for IOL for hypertension S:   O:  BP 118/62   Pulse 72   Temp 97.6 F (36.4 C) (Oral)   Resp 18   Ht 5\' 4"  (1.626 m)   Wt 93.9 kg   LMP 08/16/2022   SpO2 100%   BMI 35.53 kg/m   FHT: baseline 135 with recurrent variable decelerations  CVE: Dilation: 8 Effacement (%): 80 Station: 0 Presentation: Vertex Exam by:: Dr. Adrian Blackwater   A&P:   37 y.o. [id number] [redacted]w[redacted]d .  #Labor:  - Recurrent variable decelerations - Stopped pitocin, 1 dose of Terbutaline - Repositioning - Amniofusion  - Continue to monitor  #GBS negative   [name], MD 2:19 PM

## 2023-05-24 NOTE — Anesthesia Procedure Notes (Signed)
Epidural Patient location during procedure: OB Start time: 05/24/2023 4:57 AM End time: 05/24/2023 5:15 AM  Staffing Anesthesiologist: Trevor Iha, MD Performed: anesthesiologist   Preanesthetic Checklist Completed: patient identified, IV checked, site marked, risks and benefits discussed, surgical consent, monitors and equipment checked, pre-op evaluation and timeout performed  Epidural Patient position: sitting Prep: DuraPrep and site prepped and draped Patient monitoring: continuous pulse ox and blood pressure Approach: midline Location: L3-L4 Injection technique: LOR air  Needle:  Needle type: Tuohy  Needle gauge: 17 G Needle length: 9 cm and 9 Needle insertion depth: 6 cm Catheter type: closed end flexible Catheter size: 19 Gauge Catheter at skin depth: 12 cm Test dose: negative  Assessment Events: blood not aspirated, no cerebrospinal fluid, injection not painful, no injection resistance, no paresthesia and negative IV test  Additional Notes Patient identified. Risks/Benefits/Options discussed with patient including but not limited to bleeding, infection, nerve damage, paralysis, failed block, incomplete pain control, headache, blood pressure changes, nausea, vomiting, reactions to medication both or allergic, itching and postpartum back pain. Confirmed with bedside nurse the patient's most recent platelet count. Confirmed with patient that they are not currently taking any anticoagulation, have any bleeding history or any family history of bleeding disorders. Patient expressed understanding and wished to proceed. All questions were answered. Sterile technique was used throughout the entire procedure. Please see nursing notes for vital signs. Test dose was given through epidural needle and negative prior to continuing to dose epidural or start infusion. Warning signs of high block given to the patient including shortness of breath, tingling/numbness in hands, complete motor  block, or any concerning symptoms with instructions to call for help. Patient was given instructions on fall risk and not to get out of bed. All questions and concerns addressed with instructions to call with any issues.  1 Attempt (S) . Patient tolerated procedure well.

## 2023-05-24 NOTE — Progress Notes (Signed)
Patient ID: Christine Hudson, female   DOB: 09-11-1986, 37 y.o.   MRN: [id number]  Had 2 prolonged decelerations. Has continued to have variable and late decelerations. Unable to reposition patient. Additional pitocin leads to decelerations. Will move towards cesarean delivery - patient amenable.  The risks of cesarean section discussed with the patient included but were not limited to: bleeding which may require transfusion or reoperation; infection which may require antibiotics; injury to bowel, bladder, ureters or other surrounding organs; injury to the fetus; need for additional procedures including hysterectomy in the event of a life-threatening hemorrhage; placental abnormalities wth subsequent pregnancies, incisional problems, thromboembolic phenomenon and other postoperative/anesthesia complications. The patient concurred with the proposed plan, giving informed written consent for the procedure.   Patient has been NPO since last night she will remain NPO for procedure. Anesthesia and OR aware.  Preoperative prophylactic Ancef and azithromycin ordered on call to the OR.  To OR when ready.  Levie Heritage, DO 05/24/2023 4:13 PM

## 2023-05-24 NOTE — Progress Notes (Signed)
Labor Progress Note Christine Hudson is a 37 y.o. [id number] at [redacted]w[redacted]d presented for IOL 2/2 gHTN.   S: No acute concerns.   O:  BP (!) 140/69 (BP Location: Right Arm)   Pulse 73   Temp 98.8 F (37.1 C) (Oral)   Resp 18   Ht 5\' 4"  (1.626 m)   Wt 93.9 kg   LMP 08/16/2022   SpO2 99%   BMI 35.53 kg/m  EFM: 135bpm/moderate/+accels, no decels  CVE: Dilation: 3 Effacement (%): Thick Station: -3 Presentation: Vertex Exam by:: Dr Kathie Rhodes. [name]   A&P: 37 y.o. [id number] [redacted]w[redacted]d here for IOL 2/2 gHTN #Labor: Progressing well. S/p cytotec, contracting frequently. FB placed. Consider additional cytote if contractions space out. AROM once FB out.  #Pain: Maternally supported #FWB: Cat I  #GBS negative  gHTN BP mild range.  -continue to monitor  Roshun Klingensmith [name], DO 12:25 AM

## 2023-05-24 NOTE — Anesthesia Preprocedure Evaluation (Addendum)
Anesthesia Evaluation  Patient identified by MRN, date of birth, ID band Patient awake    Reviewed: Allergy & Precautions, NPO status , Patient's Chart, lab work & pertinent test results  Airway Mallampati: II  TM Distance: >3 FB Neck ROM: Full    Dental no notable dental hx. (+) Teeth Intact, Dental Advisory Given   Pulmonary former smoker   Pulmonary exam normal breath sounds clear to auscultation       Cardiovascular hypertension (gHtn), Normal cardiovascular exam Rhythm:Regular Rate:Normal     Neuro/Psych    GI/Hepatic Neg liver ROS,,,  Endo/Other  negative endocrine ROS    Renal/GU negative Renal ROS     Musculoskeletal   Abdominal   Peds  Hematology  (+) Blood dyscrasia, anemia Lab Results      Component                Value               Date                      WBC                      11.0 (H)            05/24/2023                HGB                      9.4 (L)             05/24/2023                HCT                      30.1 (L)            05/24/2023                PLT                      237                 05/24/2023              Anesthesia Other Findings   Reproductive/Obstetrics (+) Pregnancy                             Anesthesia Physical Anesthesia Plan  ASA: 3  Anesthesia Plan: Epidural   Post-op Pain Management:    Induction:   PONV Risk Score and Plan:   Airway Management Planned:   Additional Equipment:   Intra-op Plan:   Post-operative Plan:   Informed Consent: I have reviewed the patients History and Physical, chart, labs and discussed the procedure including the risks, benefits and alternatives for the proposed anesthesia with the patient or authorized representative who has indicated his/her understanding and acceptance.       Plan Discussed with:   Anesthesia Plan Comments: (40.1 wk G5P2 w hx of gHtn BMI 35.5 for LEA)        Anesthesia Quick Evaluation

## 2023-05-24 NOTE — Progress Notes (Signed)
Labor Progress Note Barbee Caviness is a 37 y.o. [name] at [redacted]w[redacted]d presented for IOL 2/2 gHTN.   S: No acute concerns.   O:  BP 136/83 (BP Location: Right Arm)   Pulse 81   Temp 98 F (36.7 C) (Oral)   Resp 18   Ht 5\' 4"  (1.626 m)   Wt 93.9 kg   LMP 08/16/2022   SpO2 99%   BMI 35.53 kg/m  EFM: 135bpm/moderate/+accels, no decels  CVE: Dilation: 3 Effacement (%): Thick Station: -3 Presentation: Vertex Exam by:: Dr Salvadore Dom   A&P: 37 y.o. [id number] [redacted]w[redacted]d here for IOL 2/2 gHTN #Labor: Progressing well. S/p cytotec, FB.  Start pitocin. Consider AROM at next exam.  #Pain: Maternally supported #FWB: Cat I  #GBS negative  gHTN BP mild range.  -continue to monitor  Tenzin Edelman [name], DO 3:37 AM

## 2023-05-24 NOTE — Transfer of Care (Signed)
Immediate Anesthesia Transfer of Care Note  Patient: Christine Hudson  Procedure(s) Performed: CESAREAN SECTION  Patient Location: PACU  Anesthesia Type:Epidural  Level of Consciousness: awake, alert , and oriented  Airway & Oxygen Therapy: Patient Spontanous Breathing  Post-op Assessment: Report given to RN and Post -op Vital signs reviewed and stable  Post vital signs: Reviewed and stable  Last Vitals:  Vitals Value Taken Time  BP 103/67 05/24/23 1745  Temp    Pulse 84 05/24/23 1746  Resp 16 05/24/23 1746  SpO2 95 % 05/24/23 1746  Vitals shown include unvalidated device data.  Last Pain:  Vitals:   05/24/23 1349  TempSrc: Oral  PainSc: 0-No pain         Complications: No notable events documented.

## 2023-05-25 LAB — CBC
HCT: 25.8 % — ABNORMAL LOW (ref 36.0–46.0)
Hemoglobin: 8.1 g/dL — ABNORMAL LOW (ref 12.0–15.0)
MCH: 22.4 pg — ABNORMAL LOW (ref 26.0–34.0)
MCHC: 31.4 g/dL (ref 30.0–36.0)
MCV: 71.5 fL — ABNORMAL LOW (ref 80.0–100.0)
Platelets: 224 10*3/uL (ref 150–400)
RBC: 3.61 MIL/uL — ABNORMAL LOW (ref 3.87–5.11)
RDW: 15.5 % (ref 11.5–15.5)
WBC: 16.8 10*3/uL — ABNORMAL HIGH (ref 4.0–10.5)
nRBC: 0 % (ref 0.0–0.2)

## 2023-05-25 MED ORDER — SODIUM CHLORIDE 0.9 % IV SOLN: INTRAVENOUS | Status: DC | PRN

## 2023-05-25 MED ORDER — SODIUM CHLORIDE 0.9 % IV SOLN
300.0000 mg | Freq: Once | INTRAVENOUS | Status: AC
  Administered 2023-05-25: 300 mg via INTRAVENOUS
  Filled 2023-05-25: qty 15

## 2023-05-25 NOTE — Anesthesia Postprocedure Evaluation (Signed)
Anesthesia Post Note  Patient: [name]  Procedure(s) Performed: CESAREAN SECTION     Patient location during evaluation: Mother Baby Anesthesia Type: Epidural Level of consciousness: awake, awake and alert and oriented Pain management: pain level controlled Vital Signs Assessment: post-procedure vital signs reviewed and stable Respiratory status: spontaneous breathing, nonlabored ventilation and respiratory function stable Cardiovascular status: blood pressure returned to baseline and stable Postop Assessment: no headache, no backache, no apparent nausea or vomiting, able to ambulate, adequate PO intake and patient able to bend at knees Anesthetic complications: no   No notable events documented.  Last Vitals:  Vitals:   05/25/23 0306 05/25/23 0541  BP: 116/71 (!) 124/58  Pulse: 62 63  Resp: 18 18  Temp:  36.8 C  SpO2: 100% 100%    Last Pain:  Vitals:   05/25/23 0541  TempSrc: Oral  PainSc:    Pain Goal:                Epidural/Spinal Function Cutaneous sensation: Normal sensation (05/25/23 0759)  Byren Pankow

## 2023-05-25 NOTE — Lactation Note (Signed)
This note was copied from a baby's chart. Lactation Consultation Note  Patient Name: Christine Hudson [id number] Date: 05/25/2023 Age:37 hours  Reason for consult: Initial assessment;Term  P3, [redacted]w[redacted]d, 2% weight loss  Mother states she wants to breastfeed baby. She reports he can latch but she doesn't feel like he is getting anything. RN set up a DEBP for mother and she has used once and didn't get anything and has not tried again. She reports that milk came in with her previous babies and she breast fed.   Discussed latching baby 8-12 plus times per day with feeding cues and cluster feeding triggers milk production and provides the baby with the benefits of colostrum. Discussed latching baby then giving formula. Reviewed supply and demand. Mother verbalized understanding. Encouraged to call for help with latch, as needed.  Mom made aware of O/P services, breastfeeding support groups, and our phone # for post-discharge questions.     Maternal Data Has patient been taught Hand Expression?: No Does the patient have breastfeeding experience prior to this delivery?: Yes How long did the patient breastfeed?: 18 months (daughter is now 14 years old)  Feeding Mother's Current Feeding Choice: Formula Nipple Type: Nfant Slow Flow (purple)  LATCH Score  Not observed    Interventions Interventions: Education;LC Services brochure  Discharge Pump: Manual  Consult Status Consult Status: PRN    Omar Person 05/25/2023, 6:27 PM

## 2023-05-25 NOTE — Progress Notes (Signed)
POSTPARTUM PROGRESS NOTE  POD #1  Subjective:  Christine Hudson is a 37 y.o. [id number] s/p LTCS at [redacted]w[redacted]d due to nonreassuring FHT w/ variable decels. No acute events overnight. She reports she is doing well with no bleeding. She denies any problems with ambulating, voiding or po intake. Denies nausea or vomiting. She has passed flatus. Pain is well controlled on toradol and tylenol.  Lochia is appropriate. Patient notes some light headedness when standing up but this has improved and she has walked twice, the second time easier than the first. She is unsure about contraception plans and said that she would like to defer, but was open to talking about options specifically.  Objective: Blood pressure (!) 124/58, pulse 63, temperature 98.3 F (36.8 C), temperature source Oral, resp. rate 18, height 5\' 4"  (1.626 m), weight 93.9 kg, last menstrual period 08/16/2022, SpO2 100 %, unknown if currently breastfeeding.  Physical Exam:  General: alert, cooperative and no distress Chest: no respiratory distress Heart: regular rate, distal pulses intact Uterine Fundus: firm, appropriately tender DVT Evaluation: No calf swelling or tenderness Extremities: No LE edema Skin: warm, dry; incision clean/dry/intact w/ honeycomb dressing in place  Recent Labs    05/24/23 1822 05/25/23 0413  HGB 8.3* 8.1*  HCT 27.2* 25.8*    Assessment/Plan: Christine Hudson is a 37 y.o. [id number] s/p LTCS at [redacted]w[redacted]d for nonreassuring FHT w/ variable decels.  POD#1 - Doing well; pain is controlled.  Routine postpartum care  OOB, ambulated  Lovenox 40 mg for VTE prophylaxis scheduled for 8 am 05/25/23. Anemia present: Mild light headedness upon standing, Hgb 8.1. Plan for Venofer and observe. Contraception: Unsure, discuss again. Feeding: Formula supplemented by purple nipple until she fully transitions to breast feeding. Dispo: Plan to reevaluate tomorrow for discharge.   LOS: 2 days   Kayleen Memos, Medical Student, [facility] 05/25/2023, 7:48 AM  I was personally present and re-performed the exam and MDM and verified the service and findings are accurately documented in the student's note. Hani Campusano [name], DO 05/25/23 1:08 PM

## 2023-05-26 ENCOUNTER — Encounter (HOSPITAL_COMMUNITY): Payer: Self-pay | Admitting: Family Medicine

## 2023-05-26 MED ORDER — GABAPENTIN 100 MG PO CAPS
200.0000 mg | ORAL_CAPSULE | Freq: Three times a day (TID) | ORAL | Status: DC
  Administered 2023-05-26 – 2023-05-27 (×4): 200 mg via ORAL
  Filled 2023-05-26 (×4): qty 2

## 2023-05-26 NOTE — Progress Notes (Signed)
POSTPARTUM PROGRESS NOTE  POD #1  Subjective:  Christine Hudson is a 37 y.o. [id number] s/p LTCS at [redacted]w[redacted]d due to nonreassuring FHT w/ variable decels. No acute events overnight. She reports she is doing well with no bleeding. She denies any problems with ambulating, voiding or po intake. Denies nausea or vomiting. She has passed flatus. Pain is NOT well controlled.  Lochia is appropriate.   Objective: Blood pressure 135/73, pulse 89, temperature 98.5 F (36.9 C), temperature source Oral, resp. rate 18, height 5\' 4"  (1.626 m), weight 93.9 kg, last menstrual period 08/16/2022, SpO2 100 %, unknown if currently breastfeeding.  Physical Exam:  General: alert, cooperative and no distress Chest: no respiratory distress Heart: regular rate, distal pulses intact Uterine Fundus: firm, appropriately tender DVT Evaluation: No calf swelling or tenderness Extremities: No LE edema Skin: warm, dry; incision clean/dry/intact w/ honeycomb dressing in place  Recent Labs    05/24/23 1822 05/25/23 0413  HGB 8.3* 8.1*  HCT 27.2* 25.8*     Assessment/Plan: Christine Hudson is a 37 y.o. [id number] s/p LTCS at [redacted]w[redacted]d for nonreassuring FHT w/ variable decels.  POD#1 - Doing well; pain is NOT controlled.  Routine postpartum care  OOB, ambulated  Lovenox 40 mg for VTE prophylaxis  Adjusted pain medications Anemia: s/p Venofer Contraception: Unsure Feeding: breast and bottle  Dispo: Plan to d/c tomorrow   LOS: 3 days   Myrtie Hawk, DO [facility] Fellow, Faculty practice [facility], Center for [facility] 05/26/23  7:53 AM

## 2023-05-26 NOTE — Lactation Note (Signed)
This note was copied from a baby's chart. Lactation Consultation Note  Patient Name: Christine Hudson Today's Date: 05/26/2023 Age:37 hours    Per RN the birth parent's choice is to formula feed the infant.   Maternal Data    Feeding Mother's Current Feeding Choice: Formula  LATCH Score                    Lactation Tools Discussed/Used    Interventions    Discharge    Consult Status Consult Status: Complete Date: 05/26/23    Delene Loll 05/26/2023, 7:08 PM

## 2023-05-27 ENCOUNTER — Other Ambulatory Visit (HOSPITAL_COMMUNITY): Payer: Self-pay

## 2023-05-27 DIAGNOSIS — Z98891 History of uterine scar from previous surgery: Principal | ICD-10-CM

## 2023-05-27 MED ORDER — SENNOSIDES-DOCUSATE SODIUM 8.6-50 MG PO TABS
2.0000 | ORAL_TABLET | Freq: Every day | ORAL | 0 refills | Status: DC
  Filled 2023-05-27: qty 30, 15d supply, fill #0

## 2023-05-27 MED ORDER — GABAPENTIN 100 MG PO CAPS
200.0000 mg | ORAL_CAPSULE | Freq: Two times a day (BID) | ORAL | 0 refills | Status: DC
  Filled 2023-05-27: qty 15, 4d supply, fill #0

## 2023-05-27 MED ORDER — IBUPROFEN 600 MG PO TABS
600.0000 mg | ORAL_TABLET | Freq: Four times a day (QID) | ORAL | 0 refills | Status: DC
  Filled 2023-05-27: qty 30, 8d supply, fill #0

## 2023-05-27 MED ORDER — ACETAMINOPHEN 500 MG PO TABS
1000.0000 mg | ORAL_TABLET | Freq: Four times a day (QID) | ORAL | 0 refills | Status: AC
  Filled 2023-05-27: qty 100, 13d supply, fill #0

## 2023-05-27 MED ORDER — OXYCODONE HCL 5 MG PO TABS
5.0000 mg | ORAL_TABLET | ORAL | 0 refills | Status: DC | PRN
  Filled 2023-05-27: qty 15, 3d supply, fill #0

## 2023-05-27 NOTE — Discharge Summary (Signed)
Postpartum Discharge Summary    Patient Name: Christine Hudson DOB: March 12, 1986 MRN: [id number]  Date of admission: 05/23/2023 Delivery date:05/24/2023  Delivering provider: Levie Heritage  Date of discharge: 05/27/2023  Admitting diagnosis: Gestational hypertension [O13.9] Intrauterine pregnancy: [redacted]w[redacted]d     Secondary diagnosis:  Principal Problem:   Status post primary low transverse cesarean section Active Problems:   AMA (advanced maternal age) multigravida 35+, second trimester   Gestational hypertension  Additional problems: n/a    Discharge diagnosis: Term Pregnancy Delivered                                              Post partum procedures: n/a Augmentation: N/A Complications: None  Hospital course: Induction of Labor With Cesarean Section   37 y.o. yo [id number] at [redacted]w[redacted]d was admitted to the hospital 05/23/2023 for induction of labor. Patient had a labor course significant for NRFHT. The patient went for cesarean section due to Non-Reassuring FHR. Delivery details are as follows: Membrane Rupture Time/Date: 11:00 AM ,05/24/2023   Delivery Method:C-Section, Low Transverse  Details of operation can be found in separate operative Note.  Patient had a postpartum course complicated by none. She is ambulating, tolerating a regular diet, passing flatus, and urinating well.  Patient is discharged home in stable condition on 05/27/23.      Newborn Data: Birth date:05/24/2023  Birth time:4:44 PM  Gender:Female  Living status:Living  Apgars:8 ,9  Weight:3544 g                                Magnesium Sulfate received: No BMZ received: No Rhophylac:N/A MMR:N/A T-DaP:Given prenatally Flu: No Transfusion:No  Physical exam  Vitals:   05/26/23 1522 05/26/23 2200 05/26/23 2329 05/27/23 0625  BP: 127/60 (!) 140/77 133/81 129/70  Pulse: 93 86 73 83  Resp:  19  18  Temp: 98.1 F (36.7 C) 98 F (36.7 C)  98.3 F (36.8 C)  TempSrc: Oral Oral  Oral  SpO2: 99% 99%  98%  Weight:      Height:        General: alert, cooperative, and no distress Lochia: appropriate Uterine Fundus: firm Incision: Healing well with no significant drainage, No significant erythema, Dressing is clean, dry, and intact DVT Evaluation: Calf/Ankle edema is present Labs: Lab Results  Component Value Date   WBC 16.8 (H) 05/25/2023   HGB 8.1 (L) 05/25/2023   HCT 25.8 (L) 05/25/2023   MCV 71.5 (L) 05/25/2023   PLT 224 05/25/2023      Latest Ref Rng & Units 05/23/2023    1:21 PM  CMP  Glucose 70 - 99 mg/dL 147   BUN 6 - 20 mg/dL 8   Creatinine 8.29 - 5.62 mg/dL 1.30   Sodium 865 - 784 mmol/L 134   Potassium 3.5 - 5.1 mmol/L 3.5   Chloride 98 - 111 mmol/L 105   CO2 22 - 32 mmol/L 22   Calcium 8.9 - 10.3 mg/dL 9.5   Total Protein 6.5 - 8.1 g/dL 6.3   Total Bilirubin 0.3 - 1.2 mg/dL <6.9   Alkaline Phos 38 - 126 U/L 141   AST 15 - 41 U/L 17   ALT 0 - 44 U/L 14    Edinburgh Score:    05/25/2023   12:38 PM  Edinburgh Postnatal Depression Scale  Screening Tool  I have been able to laugh and see the funny side of things. 0  I have looked forward with enjoyment to things. 0  I have blamed myself unnecessarily when things went wrong. 0  I have been anxious or worried for no good reason. 0  I have felt scared or panicky for no good reason. 0  Things have been getting on top of me. 0  I have been so unhappy that I have had difficulty sleeping. 0  I have felt sad or miserable. 0  I have been so unhappy that I have been crying. 0  The thought of harming myself has occurred to me. 0  Edinburgh Postnatal Depression Scale Total 0     After visit meds:  Allergies as of 05/27/2023   No Known Allergies      Medication List     TAKE these medications    acetaminophen 500 MG tablet Commonly known as: TYLENOL Take 2 tablets (1,000 mg total) by mouth every 6 (six) hours. What changed:  when to take this reasons to take this   gabapentin 100 MG capsule Commonly known as: NEURONTIN Take 2 capsules  (200 mg total) by mouth 2 (two) times daily.   ibuprofen 600 MG tablet Commonly known as: ADVIL Take 1 tablet (600 mg total) by mouth every 6 (six) hours.   oxyCODONE 5 MG immediate release tablet Commonly known as: Oxy IR/ROXICODONE Take 1 tablet (5 mg total) by mouth every 4 (four) hours as needed for moderate pain.   prenatal multivitamin Tabs tablet Take 1 tablet by mouth daily at 12 noon.   senna-docusate 8.6-50 MG tablet Commonly known as: Senokot-S Take 2 tablets by mouth daily.               Discharge Care Instructions  (From admission, onward)           Start     Ordered   05/27/23 0000  Discharge wound care:       Comments: C-section wound care: You may feel pain/discomfort/burning sensation for several weeks. Keep the wound area clean by washing it with mild soap and water. You don't need to scrub it. Often, just letting the water run over your wound in the shower is enough. We do not recommend creams as this can cause infection, but we do recommend oral medication (prescribed), pressure dressings and running warm water on the wound to help ease discomfort/burning pain.   05/27/23 0833             Discharge home in stable condition Infant Feeding: Bottle and Breast Infant Disposition:home with mother Discharge instruction: per After Visit Summary and Postpartum booklet. Activity: Advance as tolerated. Pelvic rest for 6 weeks.  Diet: routine diet Future Appointments:No future appointments. Follow up Visit: Message sent [facility] 7/5  Please schedule this patient for a In person postpartum visit in 6 weeks with the following provider: Any provider. Additional Postpartum F/U:Incision check 1 week and BP check 1 week  High risk pregnancy complicated by: HTN Delivery mode:  C-Section, Low Transverse  Anticipated Birth Control:  discuss op   05/27/2023 Myrtie Hawk, DO

## 2023-06-01 ENCOUNTER — Other Ambulatory Visit: Payer: Self-pay | Admitting: Family Medicine

## 2023-06-02 ENCOUNTER — Other Ambulatory Visit (HOSPITAL_COMMUNITY): Payer: Self-pay

## 2023-06-03 ENCOUNTER — Other Ambulatory Visit (HOSPITAL_COMMUNITY): Payer: Self-pay

## 2023-06-10 ENCOUNTER — Other Ambulatory Visit: Payer: Self-pay | Admitting: Family Medicine

## 2023-06-11 MED ORDER — SENNOSIDES-DOCUSATE SODIUM 8.6-50 MG PO TABS
2.0000 | ORAL_TABLET | Freq: Every day | ORAL | 0 refills | Status: DC
  Filled 2023-06-11: qty 30, 15d supply, fill #0

## 2023-06-13 ENCOUNTER — Other Ambulatory Visit: Payer: Self-pay

## 2023-06-13 ENCOUNTER — Ambulatory Visit: Payer: Medicaid Other | Admitting: *Deleted

## 2023-06-13 ENCOUNTER — Other Ambulatory Visit (HOSPITAL_COMMUNITY): Payer: Self-pay

## 2023-06-13 VITALS — BP 142/84 | HR 67 | Ht 65.0 in | Wt 190.2 lb

## 2023-06-13 DIAGNOSIS — Z0130 Encounter for examination of blood pressure without abnormal findings: Secondary | ICD-10-CM

## 2023-06-13 DIAGNOSIS — O165 Unspecified maternal hypertension, complicating the puerperium: Secondary | ICD-10-CM

## 2023-06-13 MED ORDER — NIFEDIPINE ER OSMOTIC RELEASE 30 MG PO TB24
30.0000 mg | ORAL_TABLET | Freq: Every day | ORAL | 0 refills | Status: AC
Start: 2023-06-13 — End: ?

## 2023-06-13 NOTE — Progress Notes (Signed)
Here for nurse visit for Incision check and bp check.  She is not taking any bp meds at present . DX with GHTN second trimester. C/S 05/24/23 for NRFHR after IOL for GHTN, postdates. BP 146/69. Denies edema or headaches. BP repeat 142/84. Discussed with Dr. Alysia Penna who prescribed Procardia 30 mg XL daily, bp check 2 weeks. Reviewed signs of pre-eclampsia. Incision CDI. Reviewed wound care. Nancy Fetter

## 2023-06-14 ENCOUNTER — Other Ambulatory Visit: Payer: Self-pay

## 2023-06-14 ENCOUNTER — Other Ambulatory Visit (HOSPITAL_COMMUNITY): Payer: Self-pay

## 2023-06-14 ENCOUNTER — Encounter (HOSPITAL_COMMUNITY): Payer: Self-pay | Admitting: Pharmacist

## 2023-06-23 DIAGNOSIS — Z419 Encounter for procedure for purposes other than remedying health state, unspecified: Secondary | ICD-10-CM | POA: Diagnosis not present

## 2023-06-27 ENCOUNTER — Other Ambulatory Visit: Payer: Self-pay

## 2023-06-27 ENCOUNTER — Ambulatory Visit (INDEPENDENT_AMBULATORY_CARE_PROVIDER_SITE_OTHER): Payer: Medicaid Other

## 2023-06-27 ENCOUNTER — Other Ambulatory Visit (HOSPITAL_COMMUNITY): Payer: Self-pay

## 2023-06-27 VITALS — BP 133/85 | HR 67 | Wt 191.6 lb

## 2023-06-27 DIAGNOSIS — Z0130 Encounter for examination of blood pressure without abnormal findings: Secondary | ICD-10-CM

## 2023-06-27 NOTE — Progress Notes (Signed)
Blood Pressure Check Visit  Christine Hudson is here for blood pressure check following c-section on 05/24/23. Seen by RN on 06/13/23 and found to have postpartum hypertension. Was prescribed nifedipine at that time but has not begun taking. BP today is 133/85, HR 67. Patient denies any s/s of pre-e. Does not have BP cuff at home. Reviewed patient should contact office with any headache, vision changes, or dizziness so that she can return for additional BP check. Will return for PP visit 07/21/23.  Reports mild itching at c-section incision site. Denies any burning, pain, odor, redness, or discharge at incision. Declines exam by RN today  Marjo Bicker, RN 06/27/2023  3:14 PM

## 2023-07-20 NOTE — Progress Notes (Deleted)
    Post Partum Visit Note  Christine Hudson is a 37 y.o. [phone] female who presents for a postpartum visit. She is 8 weeks postpartum following a primary cesarean section.  I have fully reviewed the prenatal and intrapartum course. The delivery was at *** gestational weeks.  Anesthesia: spinal. Postpartum course has been ***. Baby is doing well***. Baby is feeding by both breast and bottle - {formula:72}. Bleeding {vag bleed:12292}. Bowel function is {normal:32111}. Bladder function is {normal:32111}. Patient {is/is not:9024} sexually active. Contraception method is {contraceptive method:5051}. Postpartum depression screening: {gen negative/positive:315881}.   The pregnancy intention screening data noted above was reviewed. Potential methods of contraception were discussed. The patient elected to proceed with No data recorded.    Health Maintenance Due  Topic Date Due   DTaP/Tdap/Td (1 - Tdap) Never done   COVID-19 Vaccine (1 - 2023-24 season) Never done   INFLUENZA VACCINE  06/23/2023    {Common ambulatory SmartLinks:19316}  Review of Systems {ros; complete:30496}  Objective:  There were no vitals taken for this visit.   General:  {gen appearance:16600}   Breasts:  {desc; normal/abnormal/not indicated:14647}  Lungs: {lung exam:16931}  Heart:  {heart exam:5510}  Abdomen: {abdomen exam:16834}   Wound {Wound assessment:11097}  GU exam:  {desc; normal/abnormal/not indicated:14647}       Assessment:    There are no diagnoses linked to this encounter.  *** postpartum exam.   Plan:   Essential components of care per ACOG recommendations:  1.  Mood and well being: Patient with {gen negative/positive:315881} depression screening today. Reviewed local resources for support.  - Patient tobacco use? {tobacco use:25506}  - hx of drug use? {yes/no:25505}    2. Infant care and feeding:  -Patient currently breastmilk feeding? {yes/no:25502}  -Social determinants of health (SDOH)  reviewed in EPIC. No concerns***The following needs were identified***  3. Sexuality, contraception and birth spacing - Patient {DOES_DOES AVW:09811} want a pregnancy in the next year.  Desired family size is {NUMBER 1-10:22536} children.  - Reviewed reproductive life planning. Reviewed contraceptive methods based on pt preferences and effectiveness.  Patient desired {Upstream End Methods:24109} today.   - Discussed birth spacing of 18 months  4. Sleep and fatigue -Encouraged family/partner/community support of 4 hrs of uninterrupted sleep to help with mood and fatigue  5. Physical Recovery  - Discussed patients delivery and complications. She describes her labor as {description:25511} - Patient had a {CHL AMB DELIVERY:[phone]}. Patient had a {laceration:25518} laceration. Perineal healing reviewed. Patient expressed understanding - Patient has urinary incontinence? {yes/no:25515} - Patient {ACTION; IS/IS BJY:[id number]} safe to resume physical and sexual activity  6.  Health Maintenance - HM due items addressed {Yes or If no, why not?:20788} - Last pap smear  Diagnosis  Date Value Ref Range Status  02/14/2023   Final   - Negative for intraepithelial lesion or malignancy (NILM)   Pap smear {done:10129} at today's visit.  -Breast Cancer screening indicated? {indicated:25516}  7. Chronic Disease/Pregnancy Condition follow up: {Follow up:25499}   Center for [employer], [facility]

## 2023-07-21 ENCOUNTER — Ambulatory Visit: Payer: Medicaid Other | Admitting: Family Medicine

## 2023-07-24 DIAGNOSIS — Z419 Encounter for procedure for purposes other than remedying health state, unspecified: Secondary | ICD-10-CM | POA: Diagnosis not present

## 2023-08-23 DIAGNOSIS — Z419 Encounter for procedure for purposes other than remedying health state, unspecified: Secondary | ICD-10-CM | POA: Diagnosis not present

## 2023-09-23 DIAGNOSIS — Z419 Encounter for procedure for purposes other than remedying health state, unspecified: Secondary | ICD-10-CM | POA: Diagnosis not present

## 2023-10-23 DIAGNOSIS — Z419 Encounter for procedure for purposes other than remedying health state, unspecified: Secondary | ICD-10-CM | POA: Diagnosis not present

## 2023-11-23 DIAGNOSIS — Z419 Encounter for procedure for purposes other than remedying health state, unspecified: Secondary | ICD-10-CM | POA: Diagnosis not present

## 2023-12-24 DIAGNOSIS — Z419 Encounter for procedure for purposes other than remedying health state, unspecified: Secondary | ICD-10-CM | POA: Diagnosis not present

## 2023-12-26 ENCOUNTER — Other Ambulatory Visit: Payer: Self-pay

## 2024-01-21 DIAGNOSIS — Z419 Encounter for procedure for purposes other than remedying health state, unspecified: Secondary | ICD-10-CM | POA: Diagnosis not present

## 2024-03-03 ENCOUNTER — Encounter (HOSPITAL_COMMUNITY): Payer: Self-pay

## 2024-03-03 ENCOUNTER — Ambulatory Visit (HOSPITAL_COMMUNITY)
Admission: EM | Admit: 2024-03-03 | Discharge: 2024-03-03 | Disposition: A | Discharge status: Home / Self Care | Attending: Emergency Medicine | Admitting: Emergency Medicine

## 2024-03-03 DIAGNOSIS — Z113 Encounter for screening for infections with a predominantly sexual mode of transmission: Secondary | ICD-10-CM | POA: Insufficient documentation

## 2024-03-03 DIAGNOSIS — N898 Other specified noninflammatory disorders of vagina: Secondary | ICD-10-CM | POA: Insufficient documentation

## 2024-03-03 DIAGNOSIS — B9689 Other specified bacterial agents as the cause of diseases classified elsewhere: Secondary | ICD-10-CM | POA: Diagnosis not present

## 2024-03-03 DIAGNOSIS — N760 Acute vaginitis: Secondary | ICD-10-CM | POA: Insufficient documentation

## 2024-03-03 DIAGNOSIS — Z3202 Encounter for pregnancy test, result negative: Secondary | ICD-10-CM | POA: Insufficient documentation

## 2024-03-03 DIAGNOSIS — Z419 Encounter for procedure for purposes other than remedying health state, unspecified: Secondary | ICD-10-CM | POA: Diagnosis not present

## 2024-03-03 LAB — POCT URINE PREGNANCY: Preg Test, Ur: NEGATIVE

## 2024-03-03 LAB — POCT URINALYSIS DIP (MANUAL ENTRY)
Bilirubin, UA: NEGATIVE
Blood, UA: NEGATIVE
Glucose, UA: NEGATIVE mg/dL
Leukocytes, UA: NEGATIVE
Nitrite, UA: NEGATIVE
Protein Ur, POC: 30 mg/dL — AB
Spec Grav, UA: 1.025 (ref 1.010–1.025)
Urobilinogen, UA: 2 U/dL — AB
pH, UA: 7 (ref 5.0–8.0)

## 2024-03-03 NOTE — ED Provider Notes (Signed)
 [facility]    CSN: [id number] Arrival date & time: 03/03/24  1509      History   Chief Complaint Chief Complaint  Patient presents with   Vaginal Discharge   Possible Pregnancy    HPI Christine Hudson is a 38 y.o. female.   Patient presents to clinic over concern of vaginal discharge that has been present for the past few days.  It does not have an odor, she denies any vaginal irritation, pelvic pain or abdominal pain.  Vaginal discharge is just more than usual.  She had a normal menstrual cycle on March 22.  She is sexually active and is currently breast-feeding.  Not on any contraception.  Is concerned that she may be pregnant.  Denies dysuria or hematuria.  She would like HIV and syphilis screening.  The history is provided by the patient and medical records.  Vaginal Discharge Possible Pregnancy    Past Medical History:  Diagnosis Date   Medical history non-contributory    No pertinent past medical history     Patient Active Problem List   Diagnosis Date Noted   Status post primary low transverse cesarean section 05/27/2023   Gestational hypertension 05/23/2023   Supervision of other normal pregnancy, antepartum 02/14/2023   AMA (advanced maternal age) multigravida 35+, second trimester 02/14/2023    Past Surgical History:  Procedure Laterality Date   CESAREAN SECTION N/A 05/24/2023   Procedure: CESAREAN SECTION;  Surgeon: Malka Sea, DO;  Location: [facility];  Service: Obstetrics;  Laterality: N/A;   DILATION AND CURETTAGE OF UTERUS     INDUCED ABORTION      OB History     Gravida  5   Para  3   Term  3   Preterm      AB  2   Living  3      SAB      IAB  2   Ectopic      Multiple  0   Live Births  3            Home Medications    Prior to Admission medications   Medication Sig Start Date End Date Taking? Authorizing Provider  acetaminophen (TYLENOL) 500 MG tablet Take 2 tablets (1,000 mg total) by mouth every 6  (six) hours. Patient not taking: Reported on 06/27/2023 05/27/23   Mercado-Ortiz, Lindbergh Reusing, DO  NIFEdipine (PROCARDIA XL) 30 MG 24 hr tablet Take 1 tablet (30 mg total) by mouth daily. Patient not taking: Reported on 06/27/2023 06/13/23   Ervin, Michael L, MD  Prenatal Vit-Fe Fumarate-FA (PRENATAL MULTIVITAMIN) TABS tablet Take 1 tablet by mouth daily at 12 noon.    [provider]  cetirizine (ZYRTEC ALLERGY) 10 MG tablet Take 1 tablet (10 mg total) by mouth daily. 06/12/17 02/13/20  Mitchell, Jessica B, PA-C  fluticasone (FLONASE) 50 MCG/ACT nasal spray Place 1 spray into both nostrils daily. 06/12/17 02/13/20  Golden Late, PA-C    Family History Family History  Problem Relation Age of Onset   Asthma Mother    Diabetes Mother    Cancer Mother    Hypertension Father    Cancer Maternal Grandmother    Other Neg Hx    Heart disease Neg Hx     Social History Social History   Tobacco Use   Smoking status: Former    Current packs/day: 0.00    Types: Cigarettes    Quit date: 01/21/2023    Years since quitting:  1.1  Vaping Use   Vaping status: Never Used  Substance Use Topics   Alcohol use: Not Currently    Comment: occ   Drug use: Never     Allergies   Patient has no known allergies.   Review of Systems Review of Systems  Per HPI  Physical Exam Triage Vital Signs ED Triage Vitals  Encounter Vitals Group     BP 03/03/24 1601 (!) 146/91     Systolic BP Percentile --      Diastolic BP Percentile --      Pulse Rate 03/03/24 1601 89     Resp 03/03/24 1601 16     Temp 03/03/24 1601 98.4 F (36.9 C)     Temp Source 03/03/24 1601 Oral     SpO2 03/03/24 1601 96 %     Weight 03/03/24 1555 217 lb (98.4 kg)     Height 03/03/24 1555 5\' 5"  (1.651 m)     Head Circumference --      Peak Flow --      Pain Score 03/03/24 1555 0     Pain Loc --      Pain Education --      Exclude from Growth Chart --    No data found.  Updated Vital Signs BP (!) 146/91 (BP  Location: Left Arm)   Pulse 89   Temp 98.4 F (36.9 C) (Oral)   Resp 16   Ht 5\' 5"  (1.651 m)   Wt 217 lb (98.4 kg)   LMP 02/11/2024 (Approximate)   SpO2 96%   Breastfeeding Yes   BMI 36.11 kg/m   Visual Acuity Right Eye Distance:   Left Eye Distance:   Bilateral Distance:    Right Eye Near:   Left Eye Near:    Bilateral Near:     Physical Exam Vitals and nursing note reviewed.  Constitutional:      Appearance: Normal appearance.  HENT:     Head: Normocephalic and atraumatic.     Right Ear: External ear normal.     Left Ear: External ear normal.     Nose: Nose normal.     Mouth/Throat:     Mouth: Mucous membranes are moist.  Eyes:     Conjunctiva/sclera: Conjunctivae normal.  Cardiovascular:     Rate and Rhythm: Normal rate.  Pulmonary:     Effort: Pulmonary effort is normal. No respiratory distress.  Musculoskeletal:        General: Normal range of motion.  Neurological:     General: No focal deficit present.     Mental Status: She is alert.  Psychiatric:        Mood and Affect: Mood normal.      UC Treatments / Results  Labs (all labs ordered are listed, but only abnormal results are displayed) Labs Reviewed  POCT URINALYSIS DIP (MANUAL ENTRY) - Abnormal; Notable for the following components:      Result Value   Ketones, POC UA small (15) (*)    Protein Ur, POC =30 (*)    Urobilinogen, UA 2.0 (*)    All other components within normal limits  POCT URINE PREGNANCY  CERVICOVAGINAL ANCILLARY ONLY    EKG   Radiology No results found.  Procedures Procedures (including critical care time)  Medications Ordered in UC Medications - No data to display  Initial Impression / Assessment and Plan / UC Course  I have reviewed the triage vital signs and the nursing notes.  Pertinent labs & imaging  results that were available during my care of the patient were reviewed by me and considered in my medical decision making (see chart for details).  Vitals  in triage reviewed, patient is hemodynamically stable.  Urine pregnancy is negative.  Urinalysis does not reveal signs of UTI.  Cytology swab obtained and staff will contact if treatment is indicated.  Initially agreeable to HIV and syphilis screening, patient had to leave prior to completing labs.   Plan of care, follow-up care return precautions given, no questions at this time.     Final Clinical Impressions(s) / UC Diagnoses   Final diagnoses:  Vaginal discharge  Urine pregnancy test negative     Discharge Instructions      Your urine pregnancy test was negative.  Today you have been screened for vaginal infections as well as HIV and syphilis.  These results will be available over the next few days and our staff will contact you if anything is abnormal.  Abstain from intercourse until all results have been received.  Return to clinic for any new or urgent symptoms.    ED Prescriptions   None    PDMP not reviewed this encounter.   Harlow Lighter, Shayle Donahoo  N, FNP 03/03/24 1809

## 2024-03-03 NOTE — Discharge Instructions (Addendum)
 Your urine pregnancy test was negative.  Today you have been screened for vaginal infections as well as HIV and syphilis.  These results will be available over the next few days and our staff will contact you if anything is abnormal.  Abstain from intercourse until all results have been received.  Return to clinic for any new or urgent symptoms.

## 2024-03-03 NOTE — ED Triage Notes (Addendum)
 Patient here today with c/o vaginal discharge X 2 days. Patient is breastfeeding.   Patient would also like to have a pregnancy test.

## 2024-03-07 ENCOUNTER — Telehealth (HOSPITAL_COMMUNITY): Payer: Self-pay | Admitting: Emergency Medicine

## 2024-03-07 ENCOUNTER — Encounter (HOSPITAL_COMMUNITY): Payer: Self-pay | Admitting: Emergency Medicine

## 2024-03-07 LAB — CERVICOVAGINAL ANCILLARY ONLY
Bacterial Vaginitis (gardnerella): POSITIVE — AB
Candida Glabrata: NEGATIVE
Candida Vaginitis: NEGATIVE
Chlamydia: NEGATIVE
Comment: NEGATIVE
Comment: NEGATIVE
Comment: NEGATIVE
Comment: NEGATIVE
Comment: NEGATIVE
Comment: NORMAL
Neisseria Gonorrhea: NEGATIVE
Trichomonas: NEGATIVE

## 2024-03-07 MED ORDER — METRONIDAZOLE 500 MG PO TABS: 500.0000 mg | ORAL_TABLET | Freq: Two times a day (BID) | ORAL | 0 refills | Status: AC

## 2024-03-07 NOTE — Telephone Encounter (Signed)
 Attempted to contact patient, no response and unable to leave voicemail.  Sent in Flagyl for BV.

## 2024-04-02 DIAGNOSIS — Z419 Encounter for procedure for purposes other than remedying health state, unspecified: Secondary | ICD-10-CM | POA: Diagnosis not present

## 2024-05-03 DIAGNOSIS — Z419 Encounter for procedure for purposes other than remedying health state, unspecified: Secondary | ICD-10-CM | POA: Diagnosis not present

## 2024-06-02 DIAGNOSIS — Z419 Encounter for procedure for purposes other than remedying health state, unspecified: Secondary | ICD-10-CM | POA: Diagnosis not present

## 2024-07-03 DIAGNOSIS — Z419 Encounter for procedure for purposes other than remedying health state, unspecified: Secondary | ICD-10-CM | POA: Diagnosis not present

## 2024-08-03 DIAGNOSIS — Z419 Encounter for procedure for purposes other than remedying health state, unspecified: Secondary | ICD-10-CM | POA: Diagnosis not present

## 2024-09-02 DIAGNOSIS — Z419 Encounter for procedure for purposes other than remedying health state, unspecified: Secondary | ICD-10-CM | POA: Diagnosis not present

## 2024-11-02 DIAGNOSIS — Z419 Encounter for procedure for purposes other than remedying health state, unspecified: Secondary | ICD-10-CM | POA: Diagnosis not present
